# Patient Record
Sex: Male | Born: 1955 | Race: White | Hispanic: No | Marital: Married | Smoking: Never smoker
Health system: Southern US, Community
[De-identification: ages and names within clinical notes are randomized; demographics above are authoritative.]

## PROBLEM LIST (undated history)

## (undated) DIAGNOSIS — K635 Polyp of colon: Secondary | ICD-10-CM

## (undated) DIAGNOSIS — R739 Hyperglycemia, unspecified: Secondary | ICD-10-CM

## (undated) HISTORY — DX: Hyperglycemia, unspecified: R73.9

## (undated) HISTORY — DX: Polyp of colon: K63.5

---

## 1992-11-17 HISTORY — PX: BACK SURGERY: SHX140

## 2008-02-04 DIAGNOSIS — Z860101 Personal history of adenomatous and serrated colon polyps: Secondary | ICD-10-CM | POA: Insufficient documentation

## 2010-03-12 ENCOUNTER — Ambulatory Visit: Payer: Self-pay | Admitting: Urology

## 2010-03-12 IMAGING — CT CT ABD-PELV W/O CM
1 of 2 series · 15 of 32 positions shown, 19 images · non-contrast
Comparison: None

REASON FOR EXAM: hematuria
COMMENTS:

PROCEDURE:     CT  - CT ABDOMEN AND PELVIS W[DATE]  [DATE]
RESULT:     Indication: Hematuria
TECHNIQUE: Multiple axial images from the lung bases to the symphysis pubis
were obtained without oral and without intravenous contrast.

[Series 2: stone · axial · 0.78mm/px · z∈[-662,-173]mm · 15 of 179 slices shown, 19 images]
[im 8/179  soft-tissue]
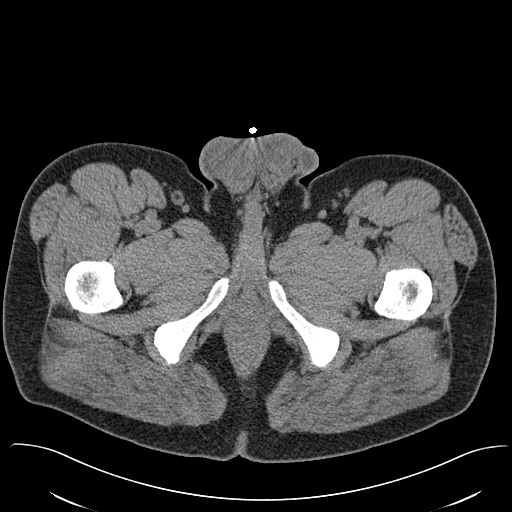
[im 8/179  bone]
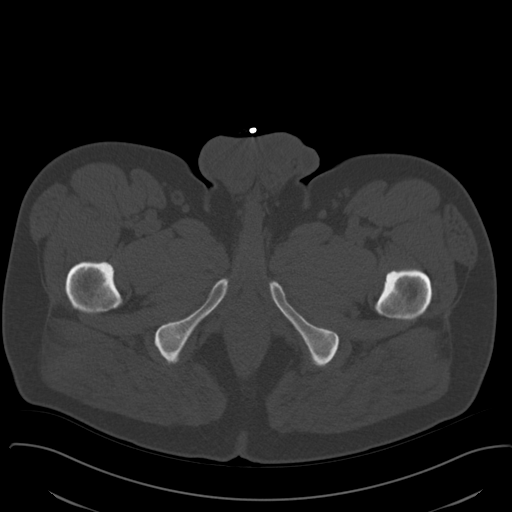
[im 24/179  soft-tissue]
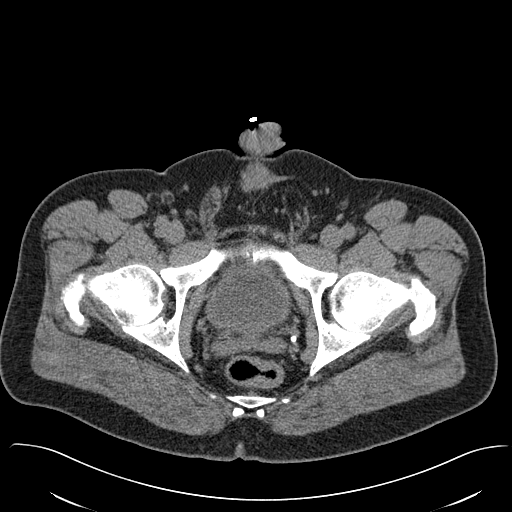
[im 39/179  soft-tissue]
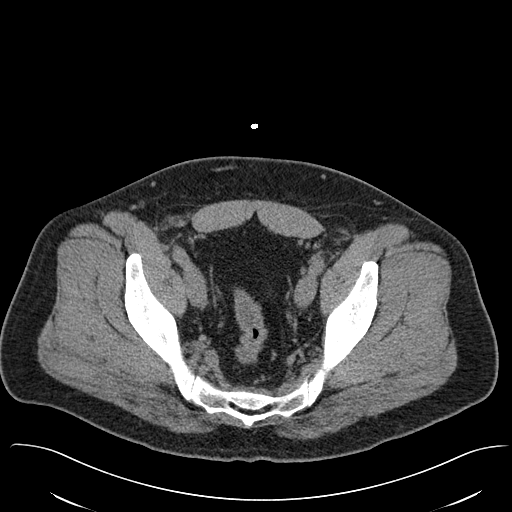
[im 47/179  soft-tissue]
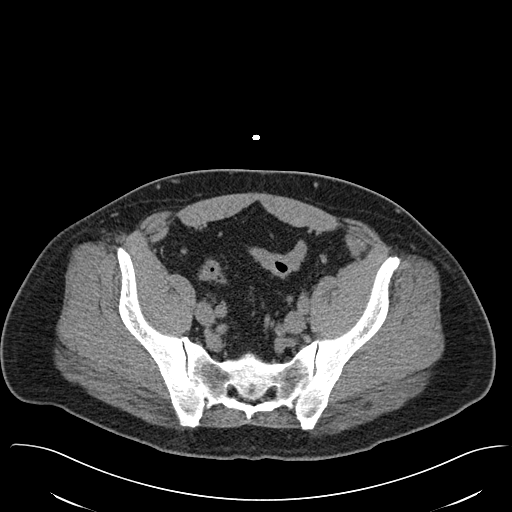
[im 62/179  soft-tissue]
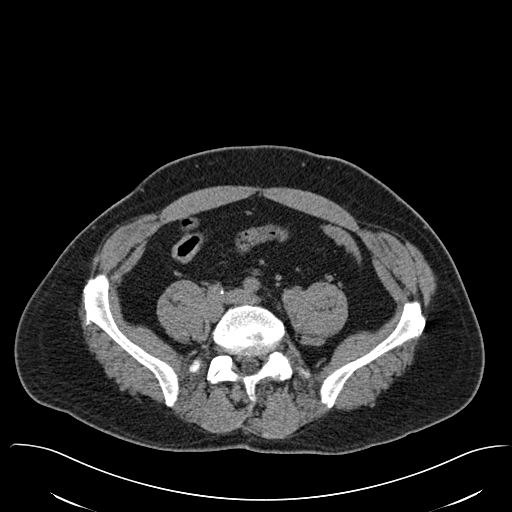
[im 78/179  soft-tissue]
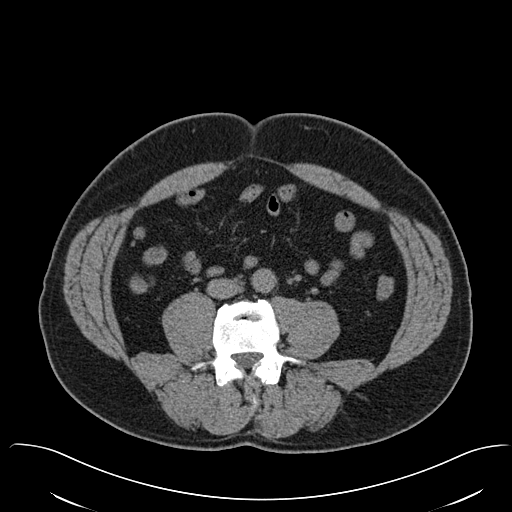
[im 93/179  soft-tissue]
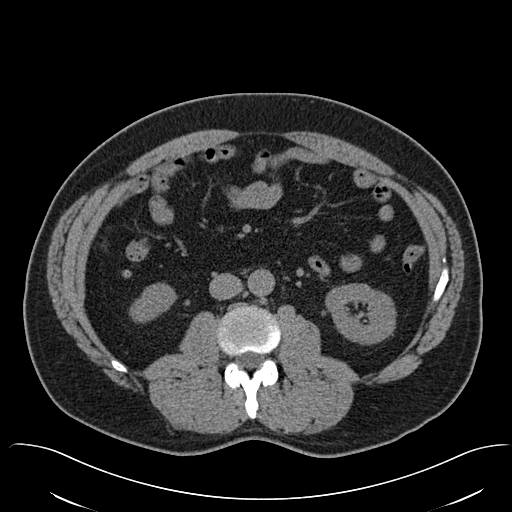
[im 101/179  soft-tissue]
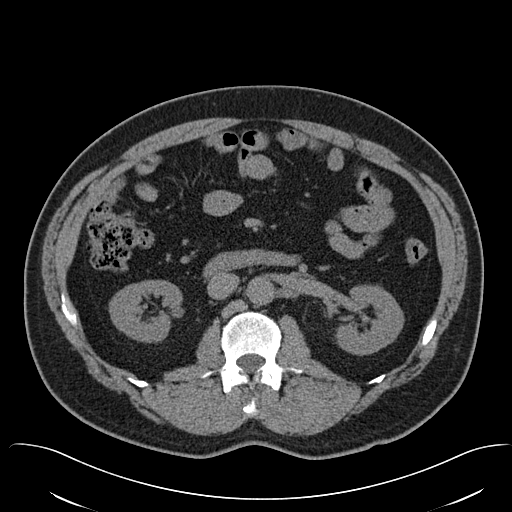
[im 117/179  soft-tissue]
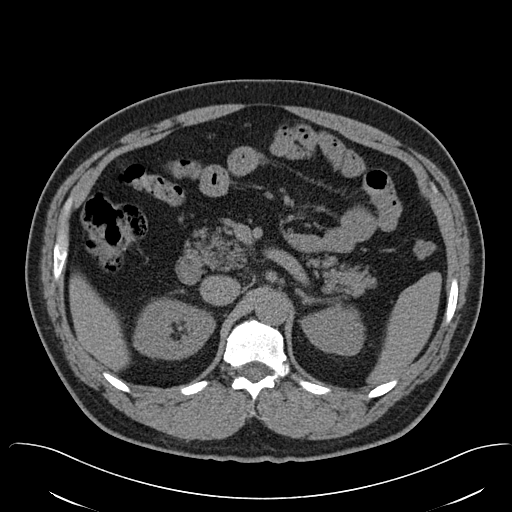
[im 117/179  bone]
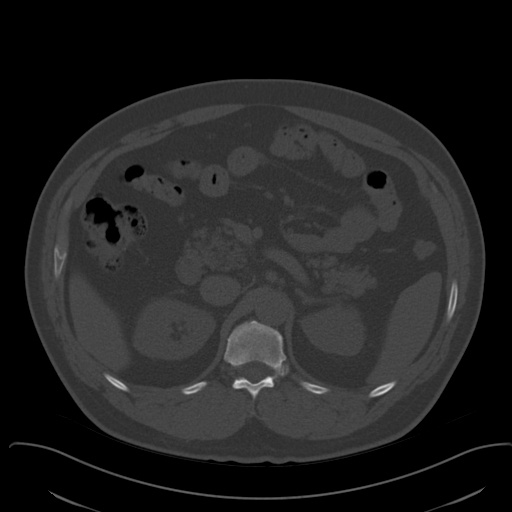
[im 132/179  soft-tissue]
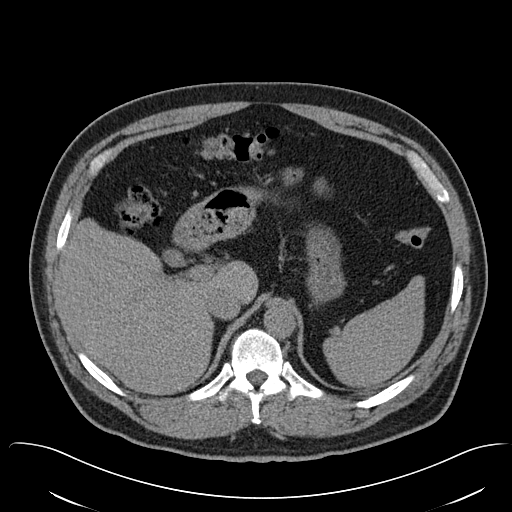
[im 140/179  soft-tissue]
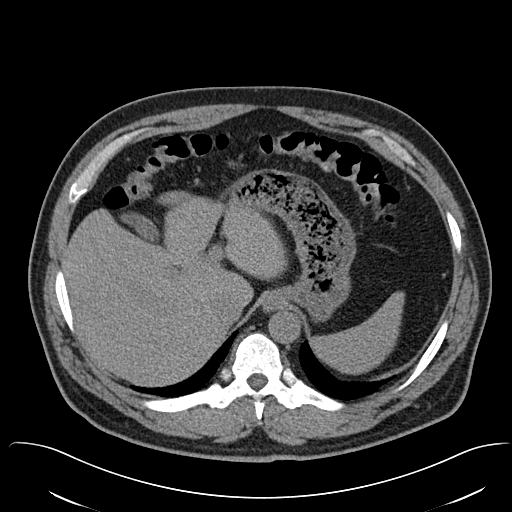
[im 148/179  lung]
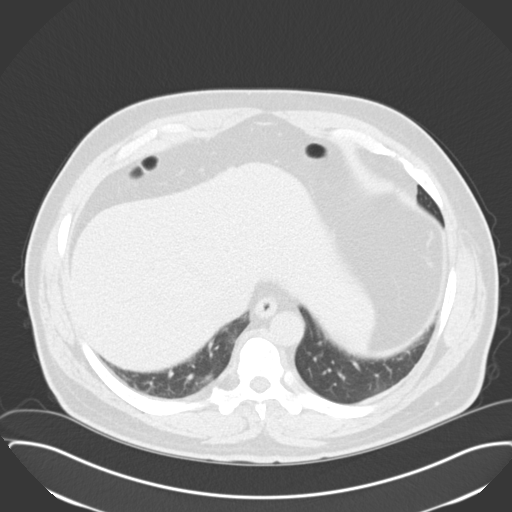
[im 155/179  soft-tissue]
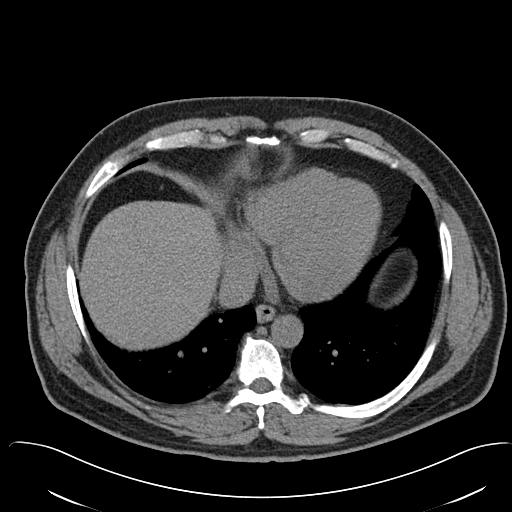
[im 155/179  lung]
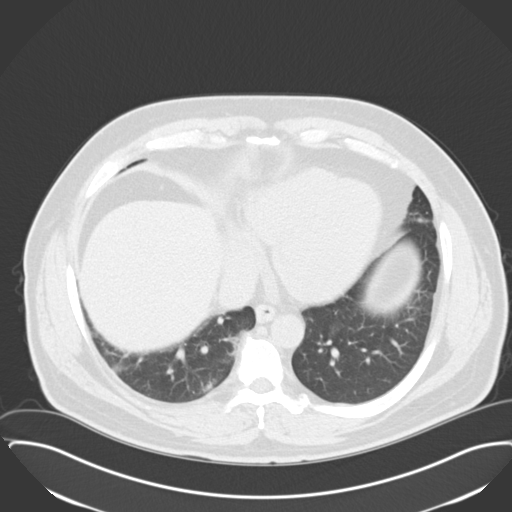
[im 163/179  lung]
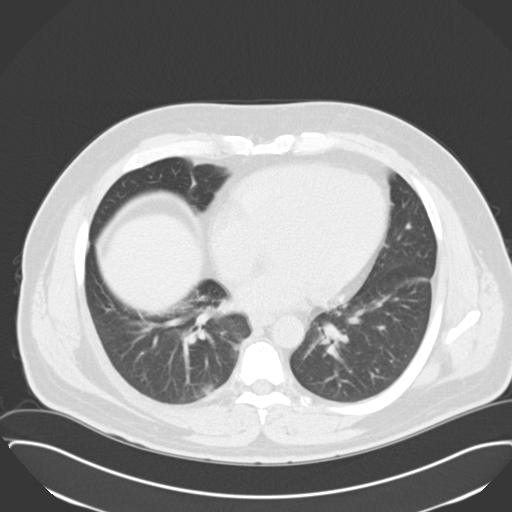
[im 171/179  soft-tissue]
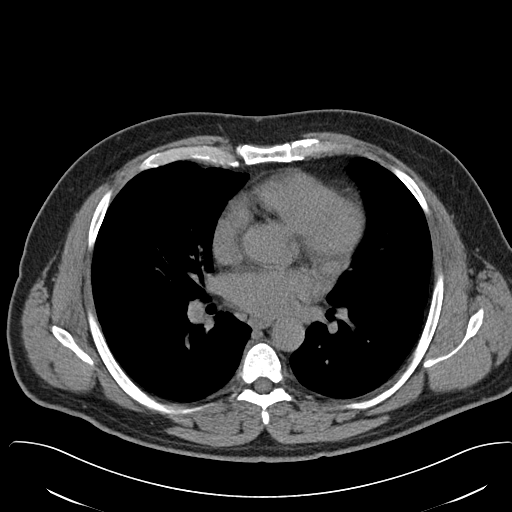
[im 171/179  lung]
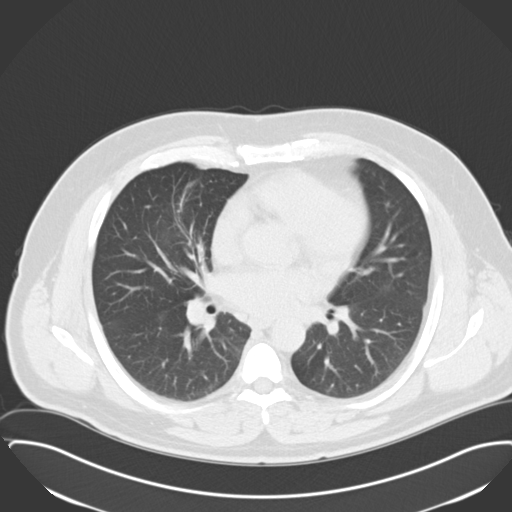

[15 of 32 positions shown; findings below may reference images not displayed]

FINDINGS: The lung bases are clear. There is no pleural or pericardial effusions.

There is a punctate nonobstructing left renal calculus. No obstructive
uropathy. No perinephric stranding is seen. The kidneys are symmetric in
size without evidence for exophytic mass. The bladder is unremarkable. The
prostate gland is enlarged measuring 5.2 x 3.9 cm with associated prostatic
calcifications.

The liver demonstrates no focal abnormality. The gallbladder is
unremarkable. The spleen demonstrates no focal abnormality. The adrenal
glands and pancreas are normal.

The unopacified stomach, duodenum, small intestine, and large intestine are
unremarkable, but evaluation is limited by lack of oral contrast. There is a
normal caliber appendix in the right lower quadrant without periappendiceal
inflammatory changes. There is no pneumoperitoneum, pneumatosis, or portal
venous gas. There is no abdominal or pelvic free fluid. There is no
lymphadenopathy.

The abdominal aorta is normal in caliber.

There is lumbar spine spondylosis.
IMPRESSION: 1. Punctate nonobstructing left renal calculus.

2. Prostatic enlargement. Correlate with PSA and physical exam.

## 2010-06-30 IMAGING — CR DG ABDOMEN 1V
1 series · 2 of 2 positions shown · non-contrast
Comparison: none

REASON FOR EXAM: nephrouithiasis
COMMENTS:

[Series 1: supine kub · 0.17mm/px · 2 of 2 slices shown]
[im 1/2]
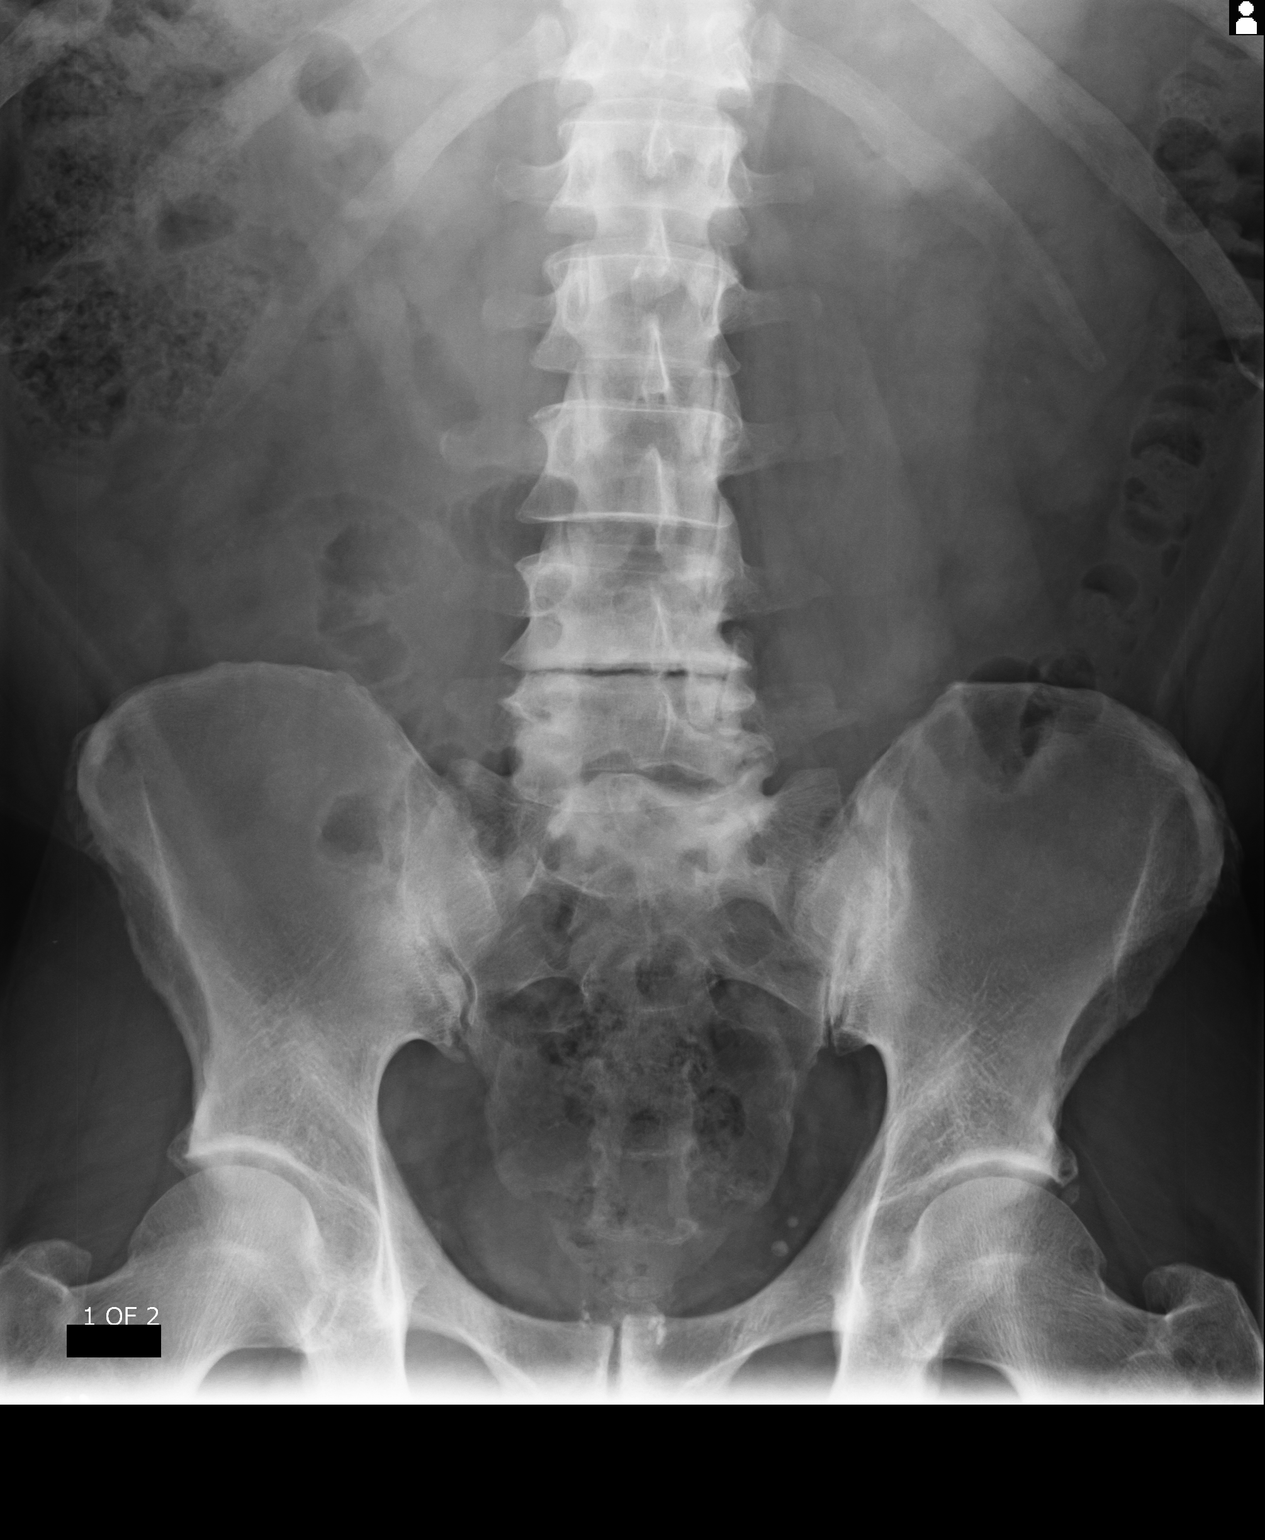
[im 2/2]
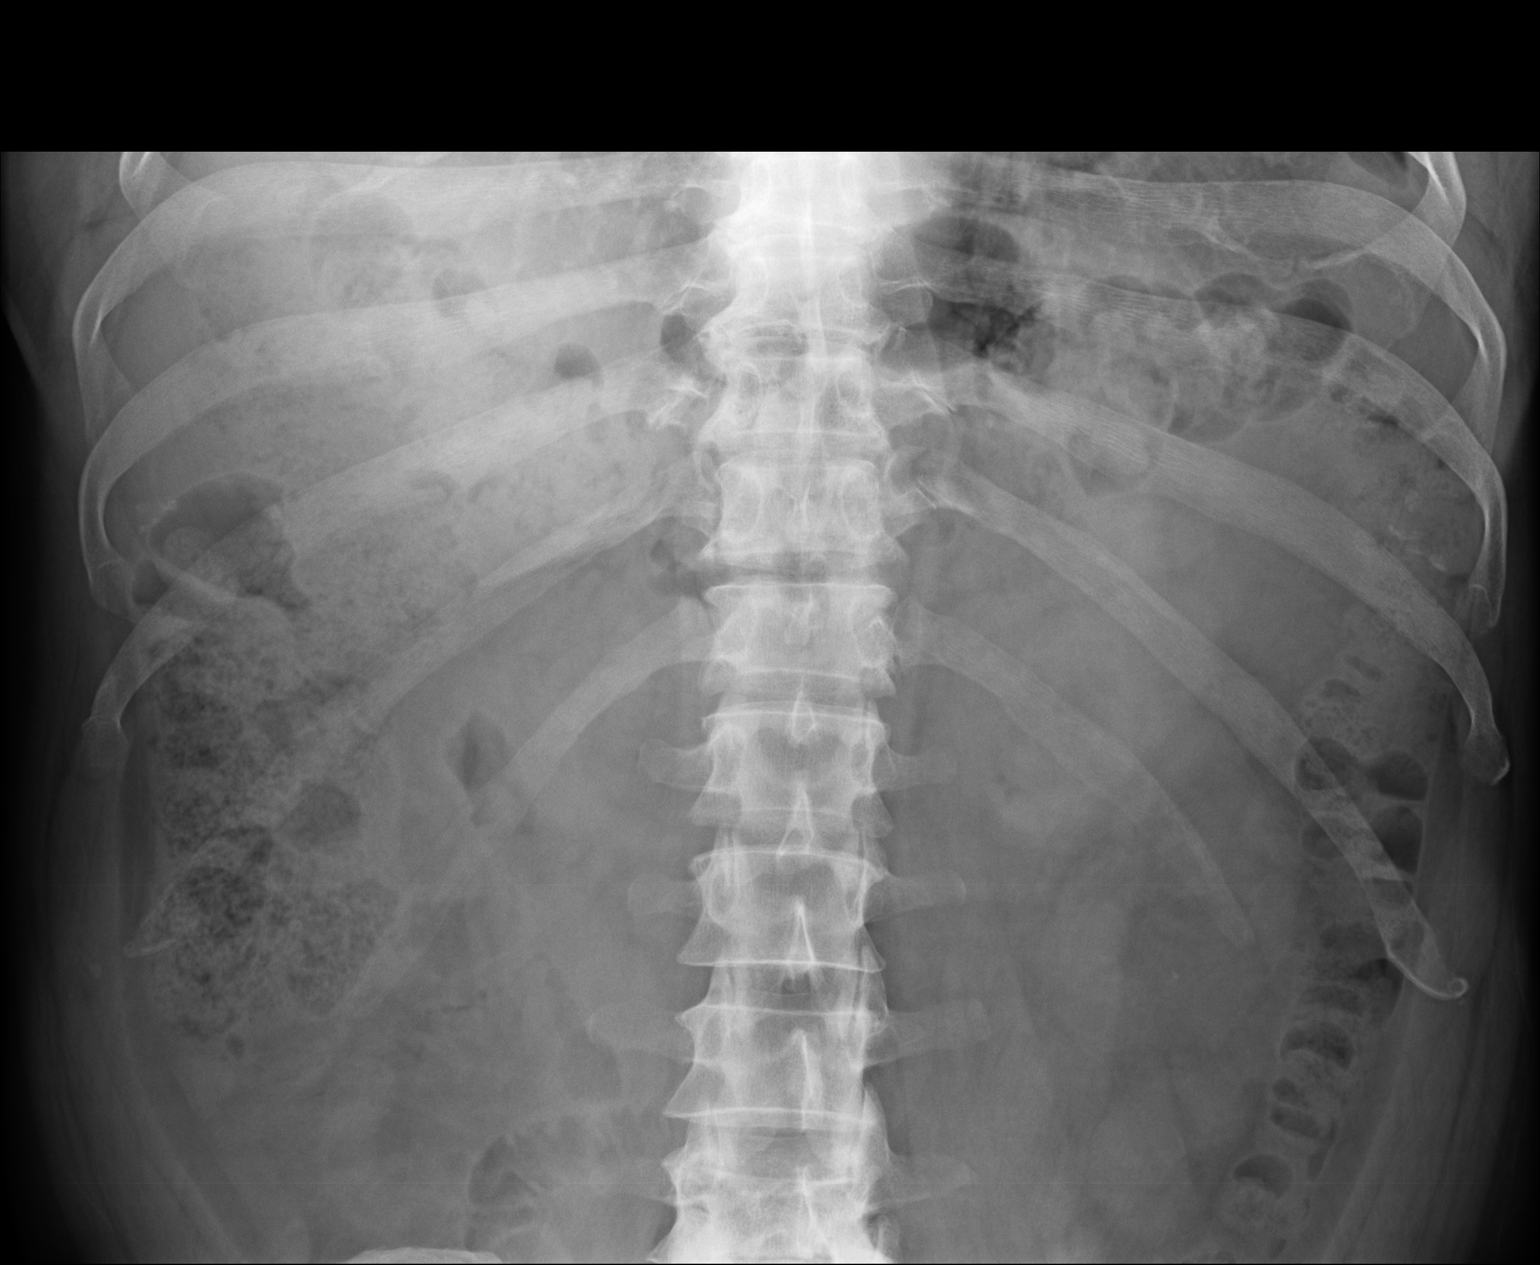

[2 of 2 positions shown; findings below may reference images not displayed]

PROCEDURE:     DXR - DXR KIDNEY URETER BLADDER  - [DATE]  [DATE]

RESULT:     Numerous rounded calcific densities are seen over the pelvic
region consistent with phleboliths. No ureteral stones are evident. The
kidneys show no definite calculi. Air and fecal material is present within
the colon to the rectum.
IMPRESSION: No nephrolithiasis or ureterolithiasis evident.

## 2010-10-22 ENCOUNTER — Ambulatory Visit: Payer: Self-pay | Admitting: Urology

## 2010-10-22 IMAGING — CR DG ABDOMEN 1V
1 series · 1 of 1 positions shown · non-contrast
Comparison: none

REASON FOR EXAM: nephrolithiasis
COMMENTS:

[view not recorded]
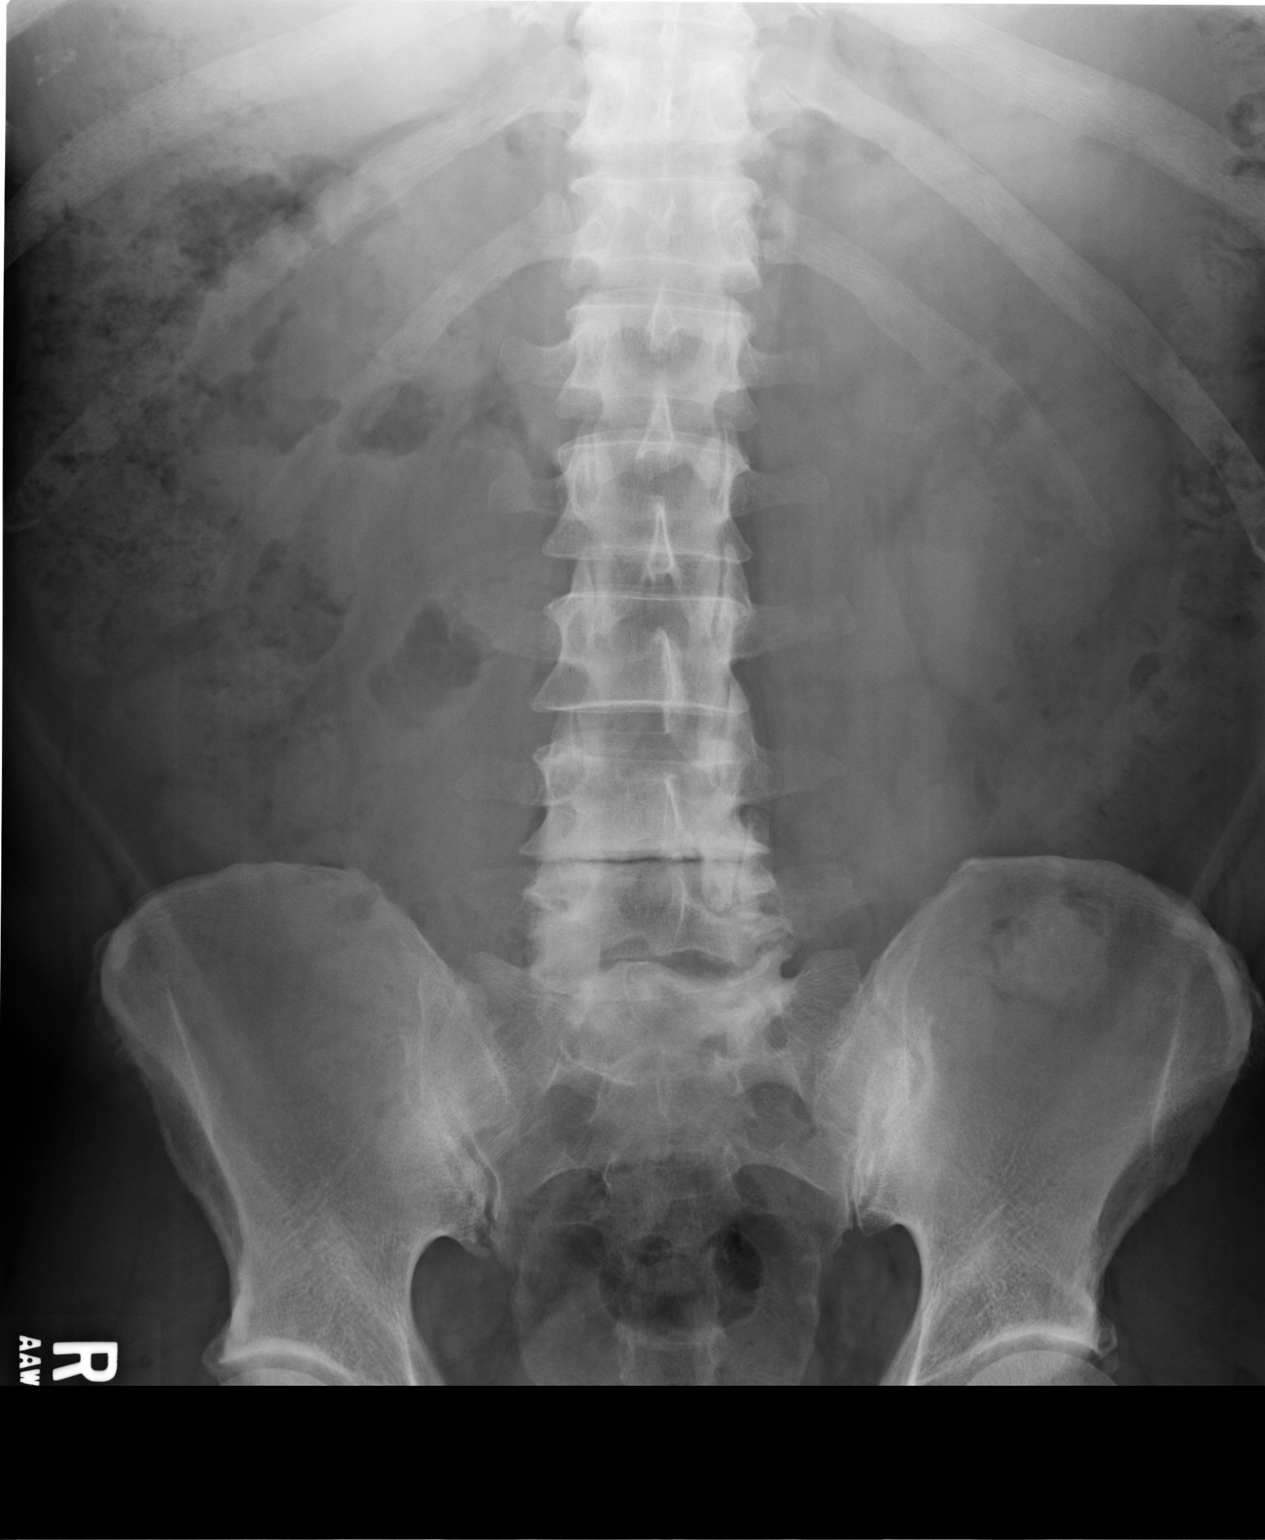

[1 of 1 positions shown; findings below may reference images not displayed]

PROCEDURE:     DXR - DXR KIDNEY URETER BLADDER  - [DATE]  [DATE]

RESULT:     There is a 2 mm calcification projected over the lower pole of
the left kidney consistent with left nephrolithiasis as previously observed
at CT. No ureteral calcifications are seen. Phleboliths are noted in the
left pelvis. No right renal calcifications are observed. There are changes
consistent with degenerative disc disease of the lower lumbar spine.
IMPRESSION: 1. Left nephrolithiasis.
2. There are degenerative changes of the lower lumbar spine.

## 2011-10-27 ENCOUNTER — Ambulatory Visit: Payer: Self-pay | Admitting: Urology

## 2013-07-07 HISTORY — PX: COLONOSCOPY: SHX174

## 2014-01-02 ENCOUNTER — Ambulatory Visit: Payer: Self-pay | Admitting: Family Medicine

## 2014-01-02 IMAGING — CR DG CHEST 2V
1 series · 2 of 2 positions shown · non-contrast
Comparison: None.

CLINICAL DATA: Right chest pain

EXAM:
CHEST  2 VIEW

[Series 1: pa · 0.17mm/px · 2 of 2 slices shown]
[im 1/2]
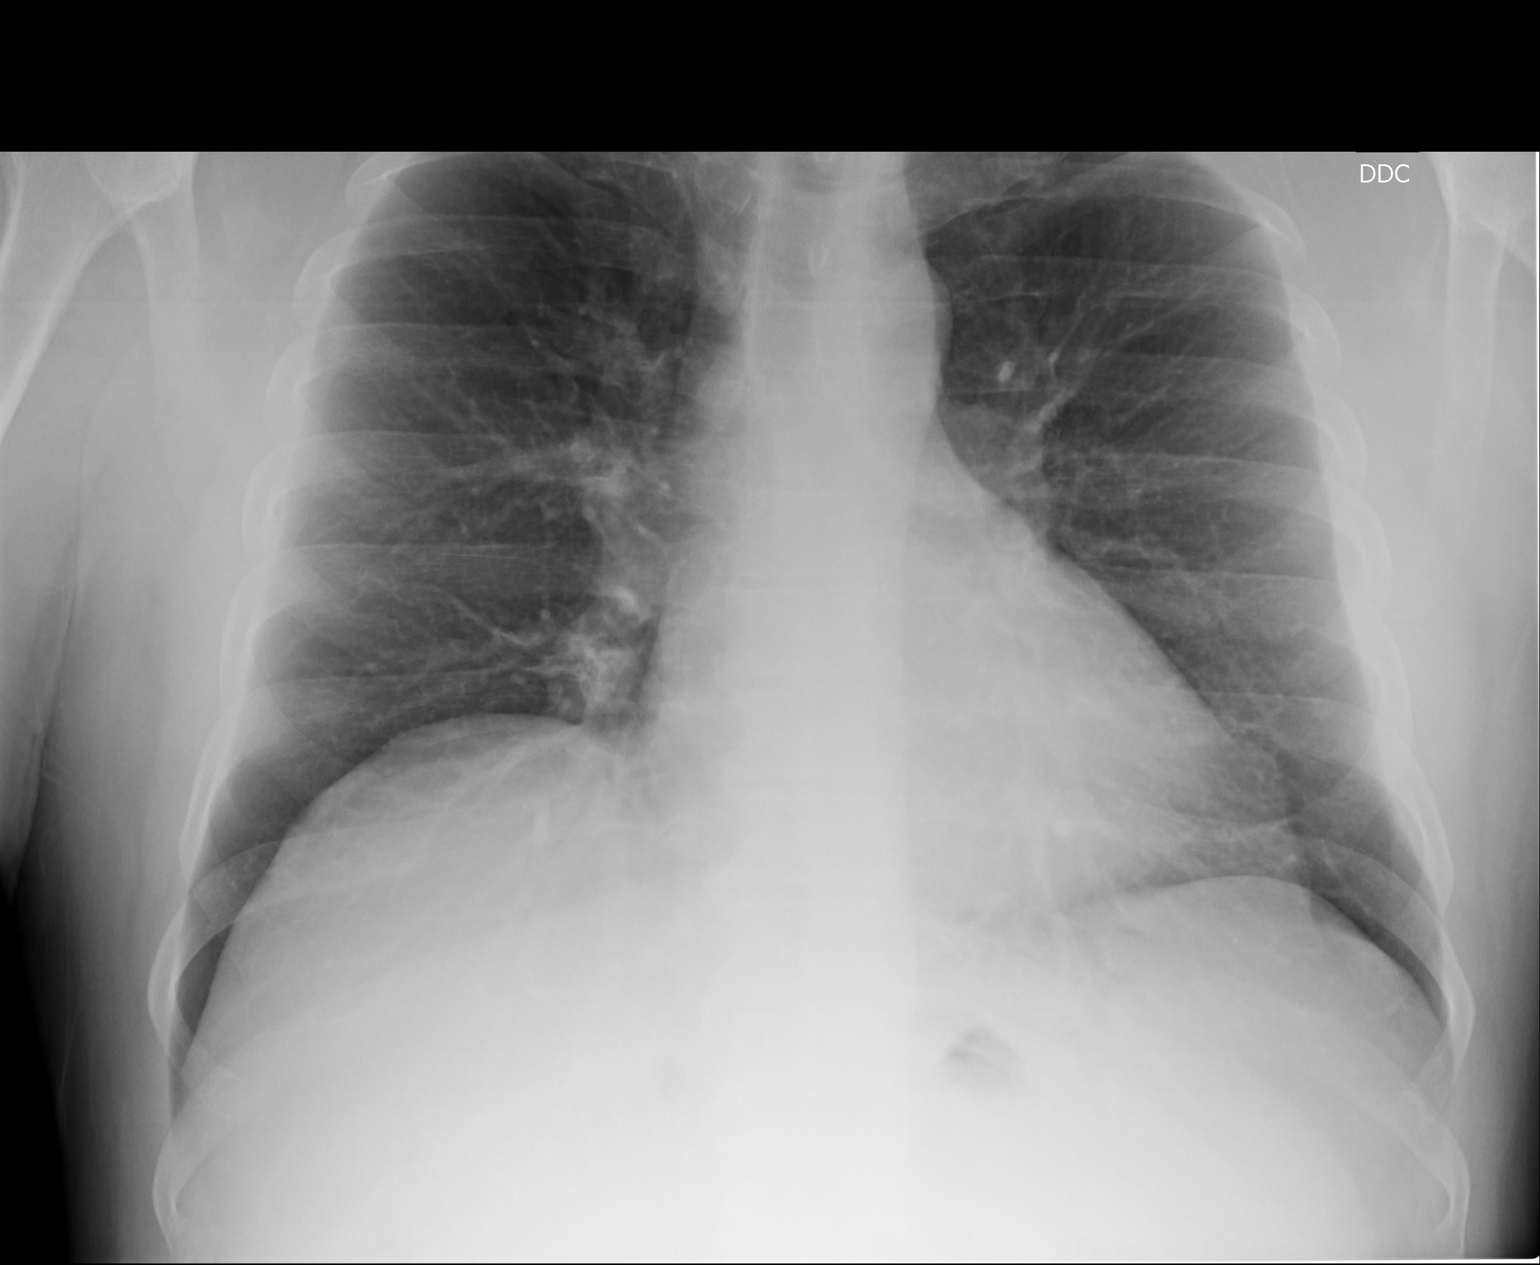
[im 2/2]
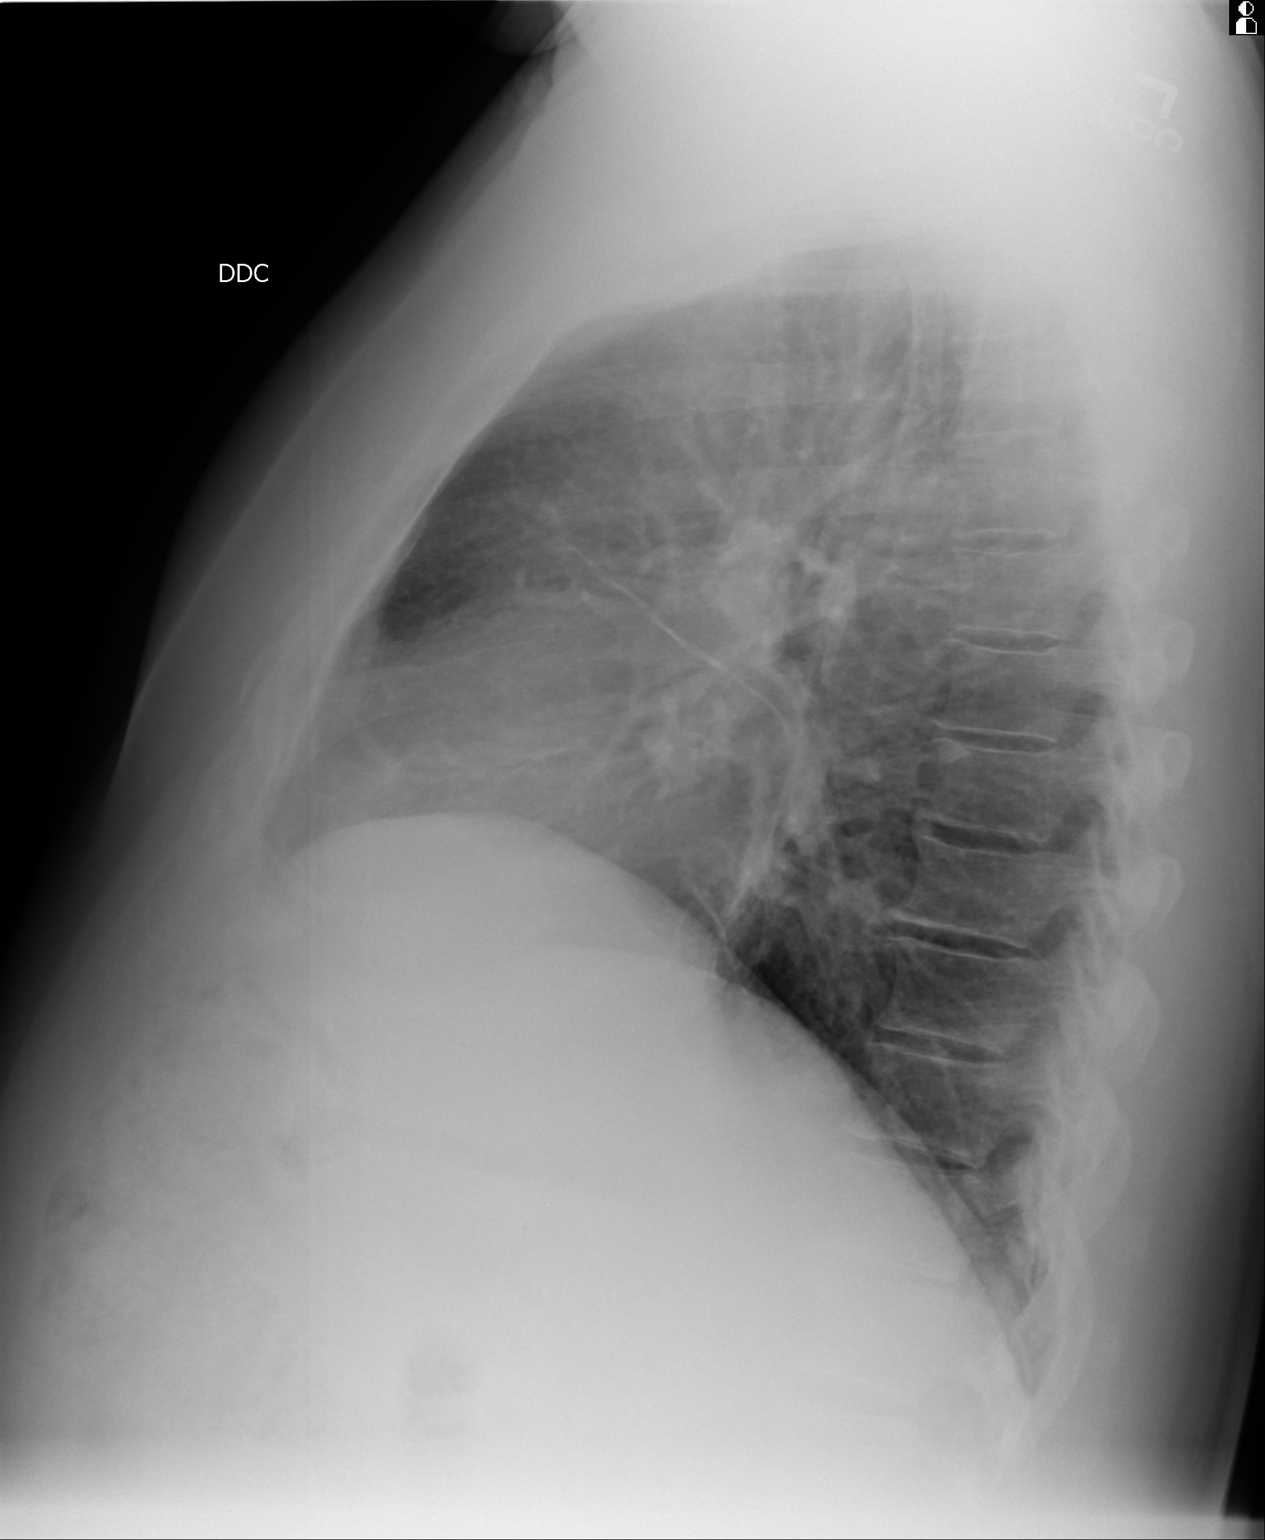

[2 of 2 positions shown; findings below may reference images not displayed]

FINDINGS: The lungs are clear and negative for focal airspace consolidation,
pulmonary edema or suspicious pulmonary nodule. No pleural effusion
or pneumothorax. Mild central bronchitic changes. Cardiac and
mediastinal contours are within normal limits. No acute fracture or
lytic or blastic osseous lesions. The visualized upper abdominal
bowel gas pattern is unremarkable.
IMPRESSION: No active cardiopulmonary disease.

## 2014-10-19 LAB — CBC AND DIFFERENTIAL
HCT: 48 % (ref 41–53)
Hemoglobin: 16.9 g/dL (ref 13.5–17.5)
PLATELETS: 224 10*3/uL (ref 150–399)
WBC: 4.6 10*3/mL

## 2015-03-28 LAB — LIPID PANEL
CHOLESTEROL: 210 mg/dL — AB (ref 0–200)
HDL: 53 mg/dL (ref 35–70)
LDL Cholesterol: 127 mg/dL
Triglycerides: 149 mg/dL (ref 40–160)

## 2015-03-28 LAB — BASIC METABOLIC PANEL
BUN: 16 mg/dL (ref 4–21)
CREATININE: 1.1 mg/dL (ref 0.6–1.3)
Glucose: 106 mg/dL
POTASSIUM: 4.7 mmol/L (ref 3.4–5.3)
Sodium: 140 mmol/L (ref 137–147)

## 2015-03-28 LAB — HEMOGLOBIN A1C: HEMOGLOBIN A1C: 5.6

## 2015-03-28 LAB — PSA: PSA: 0.5

## 2015-03-28 LAB — TSH: TSH: 8.15 u[IU]/mL — AB (ref 0.41–5.90)

## 2016-03-27 DIAGNOSIS — F439 Reaction to severe stress, unspecified: Secondary | ICD-10-CM | POA: Insufficient documentation

## 2016-03-27 DIAGNOSIS — R7303 Prediabetes: Secondary | ICD-10-CM | POA: Insufficient documentation

## 2016-03-27 DIAGNOSIS — E669 Obesity, unspecified: Secondary | ICD-10-CM | POA: Insufficient documentation

## 2016-03-27 DIAGNOSIS — E039 Hypothyroidism, unspecified: Secondary | ICD-10-CM | POA: Insufficient documentation

## 2016-03-27 DIAGNOSIS — R739 Hyperglycemia, unspecified: Secondary | ICD-10-CM | POA: Insufficient documentation

## 2016-03-27 DIAGNOSIS — R0789 Other chest pain: Secondary | ICD-10-CM | POA: Insufficient documentation

## 2016-03-31 ENCOUNTER — Encounter: Payer: Self-pay | Admitting: Family Medicine

## 2016-03-31 ENCOUNTER — Ambulatory Visit (INDEPENDENT_AMBULATORY_CARE_PROVIDER_SITE_OTHER): Payer: BLUE CROSS/BLUE SHIELD | Admitting: Family Medicine

## 2016-03-31 VITALS — BP 120/78 | HR 55 | Temp 98.5°F | Resp 16 | Ht 68.0 in | Wt 215.0 lb

## 2016-03-31 DIAGNOSIS — Z0000 Encounter for general adult medical examination without abnormal findings: Secondary | ICD-10-CM | POA: Diagnosis not present

## 2016-03-31 DIAGNOSIS — Z86010 Personal history of colonic polyps: Secondary | ICD-10-CM | POA: Diagnosis not present

## 2016-03-31 DIAGNOSIS — E039 Hypothyroidism, unspecified: Secondary | ICD-10-CM

## 2016-03-31 DIAGNOSIS — E038 Other specified hypothyroidism: Secondary | ICD-10-CM | POA: Diagnosis not present

## 2016-03-31 DIAGNOSIS — E669 Obesity, unspecified: Secondary | ICD-10-CM | POA: Diagnosis not present

## 2016-03-31 DIAGNOSIS — R739 Hyperglycemia, unspecified: Secondary | ICD-10-CM | POA: Diagnosis not present

## 2016-03-31 NOTE — Patient Instructions (Addendum)
   Please contact your eyecare professional to schedule a routine eye exam   It is recommended to engage in 150 minutes of moderate exercise every week.   

## 2016-03-31 NOTE — Progress Notes (Signed)
Patient: Reginald Simpson, Male    DOB: 14-Jun-1956, 60 y.o.   MRN: [id number] Visit Date: 03/31/2016  Today's Provider: Mila Merryonald Fisher, MD   Chief Complaint  Patient presents with  . Annual Exam  . Hypothyroidism  . Hyperglycemia   Subjective:    Annual physical exam Reginald Simpson is a 60 y.o. male who presents today for health maintenance and complete physical. He feels well. He reports exercising; yes/walking. He reports he is sleeping fairly well.  -----------------------------------------------------------------  Follow-up for subclinical hypothyroidism from 03/28/2015, no changes. Follow-up for hyperglycemia from 03/28/2015; advised to avoid sweets and starchy foods.     Review of Systems  Constitutional: Negative for fever, chills, appetite change and fatigue.  HENT: Negative for congestion, ear pain, hearing loss, nosebleeds and trouble swallowing.   Eyes: Negative for pain and visual disturbance.  Respiratory: Negative for cough, chest tightness and shortness of breath.   Cardiovascular: Negative for chest pain, palpitations and leg swelling.  Gastrointestinal: Negative for nausea, vomiting, abdominal pain, diarrhea, constipation and blood in stool.  Endocrine: Negative for polydipsia, polyphagia and polyuria.  Genitourinary: Negative for dysuria and flank pain.  Musculoskeletal: Negative for myalgias, back pain, joint swelling, arthralgias and neck stiffness.  Skin: Negative for color change, rash and wound.  Neurological: Negative for dizziness, tremors, seizures, speech difficulty, weakness, light-headedness and headaches.  Psychiatric/Behavioral: Negative for behavioral problems, confusion, sleep disturbance, dysphoric mood and decreased concentration. The patient is not nervous/anxious.     Social History      He  reports that he has never smoked. He does not have any smokeless tobacco history on file. He reports that he drinks alcohol. He reports that he does  not use illicit drugs.       Social History   Social History  . Marital Status: Married    Spouse Name: N/A  . Number of Children: 3  . Years of Education: N/A   Occupational History  . Self employed    Social History Main Topics  . Smoking status: Never Smoker   . Smokeless tobacco: None  . Alcohol Use: 0.0 oz/week    0 Standard drinks or equivalent per week     Comment: 2-3 beers or glasses of wine 3 night each week  . Drug Use: No  . Sexual Activity: Not Asked   Other Topics Concern  . None   Social History Narrative    Past Medical History  Diagnosis Date  . Thyroid disease   . Colon polyps   . Hyperglycemia      Patient Active Problem List   Diagnosis Date Noted  . 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 Obesity 03/27/2016  . Chest wall pain 03/27/2016  . Hyperglycemia 03/27/2016  . Elevated TSH 03/27/2016  . Situational stress 03/27/2016  . Family history of prostate cancer 06/17/2010  . Hematuria 01/22/2010  . History of colon polyps 02/04/2008    Past Surgical History  Procedure Laterality Date  . Colonoscopy  07/07/2013    Mild active colitis of Cecum, [facility]plastic [facility] rect[facility]  . Back surgery  1994    Family History        Family Status  Relation Status Death Age  . Mother Alive   . Father Deceased [id number]    Cause of Death: Prostate cancer  . Sister Alive   . Brother Alive   . Brother Alive         His family  history includes Hypertension in his mother; Prostate cancer (age of onset: 25) in his father.    No Known Allergies  Previous Medications   ASPIRIN 81 MG TABLET    Take 81 mg by mouth daily.    Patient Care Team: Malva Limes, MD as PCP - General (Family Medicine) Riki Altes, MD (Urology)     Objective:   Vitals: BP 120/78 mmHg  Pulse 55  Temp(Src) 98.5 F (36.9 C) (Oral)  Resp 16  Ht  (1.727 m)  Wt 215 lb (97.523 kg)  BMI 32.70 kg/m2  SpO2 95%   Physical  Exam   General Appearance:    Alert, cooperative, no distress, appears stated age, obese  Head:    Normocephalic, without obvious abnormality, atraumatic  Eyes:    PERRL, conjunctiva/corneas clear, EOM's intact, fundi    benign, both eyes       Ears:    Normal TM's and external ear canals, both ears  Nose:   Nares normal, septum midline, mucosa normal, no drainage   or sinus tenderness  Throat:   Lips, mucosa, and tongue normal; teeth and gums normal  Neck:   Supple, symmetrical, trachea midline, no adenopathy;       thyroid:  No enlargement/tenderness/nodules; no carotid   bruit or JVD  Back:     Symmetric, no curvature, ROM normal, no CVA tenderness  Lungs:     Clear to auscultation bilaterally, respirations unlabored  Chest wall:    No tenderness or deformity  Heart:    Regular rate and rhythm, S1 and S2 normal, no murmur, rub   or gallop  Abdomen:     Soft, non-tender, bowel sounds active all four quadrants,    no masses, no organomegaly  Genitalia:    deferred  Rectal:    deferred  Extremities:   Extremities normal, atraumatic, no cyanosis or edema  Pulses:   2+ and symmetric all extremities  Skin:   Skin color, texture, turgor normal, no rashes or lesions  Lymph nodes:   Cervical, supraclavicular, and axillary nodes normal  Neurologic:   CNII-XII intact. Normal strength, sensation and reflexes      throughout     Depression Screen PHQ 2/9 Scores 03/31/2016  PHQ - 2 Score 0  PHQ- 9 Score 0      Assessment & Plan:     Routine Health Maintenance and Physical Exam  Exercise Activities and Dietary recommendations Goals    None      Immunization History  Administered Date(s) Administered  . Tdap 01/05/2008    Health Maintenance  Topic Date Due  . HIV Screening  10/12/1972  . INFLUENZA VACCINE  06/17/2016  . TETANUS/TDAP  01/04/2018  . COLONOSCOPY  07/07/2018  . Hepatitis C Screening  Completed      Discussed health benefits of physical activity, and  encouraged him to engage in regular exercise appropriate for his age and condition.    --------------------------------------------------------------------  1. Annual physical exam Generally doing well  - Lipid panel - Comprehensive metabolic panel - PSA  2. Subclinical hypothyroidism  - T4 AND TSH  3. Obesity D&E  4. Hyperglycemia  - Hemoglobin A1c  5. History of colon polyps Colonoscopy in 2015.

## 2016-04-01 LAB — COMPREHENSIVE METABOLIC PANEL
ALBUMIN: 4.3 g/dL (ref 3.5–5.5)
ALK PHOS: 82 IU/L (ref 39–117)
ALT: 50 IU/L — ABNORMAL HIGH (ref 0–44)
AST: 29 IU/L (ref 0–40)
Albumin/Globulin Ratio: 1.9 (ref 1.2–2.2)
BUN / CREAT RATIO: 18 (ref 9–20)
BUN: 16 mg/dL (ref 6–24)
Bilirubin Total: 0.7 mg/dL (ref 0.0–1.2)
CALCIUM: 8.7 mg/dL (ref 8.7–10.2)
CO2: 25 mmol/L (ref 18–29)
CREATININE: 0.88 mg/dL (ref 0.76–1.27)
Chloride: 100 mmol/L (ref 96–106)
GFR calc Af Amer: 109 mL/min/{1.73_m2} (ref >59)
GFR, EST NON AFRICAN AMERICAN: 95 mL/min/{1.73_m2} (ref >59)
GLUCOSE: 111 mg/dL — AB (ref 65–99)
Globulin, Total: 2.3 g/dL (ref 1.5–4.5)
Potassium: 4.7 mmol/L (ref 3.5–5.2)
Sodium: 140 mmol/L (ref 134–144)
Total Protein: 6.6 g/dL (ref 6.0–8.5)

## 2016-04-01 LAB — LIPID PANEL
Chol/HDL Ratio: 4 ratio units (ref 0.0–5.0)
Cholesterol, Total: 218 mg/dL — ABNORMAL HIGH (ref 100–199)
HDL: 55 mg/dL (ref >39)
LDL Calculated: 130 mg/dL — ABNORMAL HIGH (ref 0–99)
Triglycerides: 163 mg/dL — ABNORMAL HIGH (ref 0–149)
VLDL CHOLESTEROL CAL: 33 mg/dL (ref 5–40)

## 2016-04-01 LAB — HEMOGLOBIN A1C
ESTIMATED AVERAGE GLUCOSE: 114 mg/dL
Hgb A1c MFr Bld: 5.6 % (ref 4.8–5.6)

## 2016-04-01 LAB — T4 AND TSH
T4 TOTAL: 6.9 ug/dL (ref 4.5–12.0)
TSH: 6.08 u[IU]/mL — AB (ref 0.450–4.500)

## 2016-04-01 LAB — PSA: PROSTATE SPECIFIC AG, SERUM: 0.5 ng/mL (ref 0.0–4.0)

## 2016-04-17 ENCOUNTER — Encounter: Payer: Self-pay | Admitting: Family Medicine

## 2017-03-17 HISTORY — PX: SHOULDER ARTHROSCOPY: SHX128

## 2017-04-06 ENCOUNTER — Encounter: Payer: Self-pay | Admitting: Family Medicine

## 2017-05-27 ENCOUNTER — Ambulatory Visit (INDEPENDENT_AMBULATORY_CARE_PROVIDER_SITE_OTHER): Payer: Self-pay | Admitting: Family Medicine

## 2017-05-27 ENCOUNTER — Encounter: Payer: Self-pay | Admitting: Family Medicine

## 2017-05-27 VITALS — BP 128/82 | HR 58 | Temp 98.8°F | Resp 16 | Ht 68.0 in | Wt 224.0 lb

## 2017-05-27 DIAGNOSIS — Z6834 Body mass index (BMI) 34.0-34.9, adult: Secondary | ICD-10-CM

## 2017-05-27 DIAGNOSIS — Z125 Encounter for screening for malignant neoplasm of prostate: Secondary | ICD-10-CM

## 2017-05-27 DIAGNOSIS — R5383 Other fatigue: Secondary | ICD-10-CM

## 2017-05-27 DIAGNOSIS — Z0000 Encounter for general adult medical examination without abnormal findings: Secondary | ICD-10-CM

## 2017-05-27 NOTE — Progress Notes (Signed)
Patient: Reginald Simpson, Male    DOB: 1956/04/26, 61 y.o.   MRN: [id number] Visit Date: 05/27/2017  Today's Provider: [name], MD   Chief Complaint  Patient presents with  . Annual Exam  . Hyperglycemia  . Obesity   Subjective:    Annual physical exam Reginald Simpson is a 61 y.o. male who presents today for health maintenance and complete physical. He feels well. He reports exercising occasionally. He has not been active since he had his shoulder surgery in 03/2017. He reports he is sleeping fairly well.  Colonoscopy- 2014. 2 polyps removed. Repeat in 3-5 years.  Tdap- 01/05/2008.    Hyperglycemia, Follow-up:   Lab Results  Component Value Date   HGBA1C 5.6 03/31/2016   HGBA1C 5.6 03/28/2015   GLUCOSE 111 (H) 03/31/2016    Last seen for for this 1 years ago.  Management since then includes no changes. Current symptoms include none and have been stable.  Weight trend: stable Prior visit with dietician: no Current diet: well balanced Current exercise: no regular exercise  Pertinent Labs:    Component Value Date/Time   CHOL 218 (H) 03/31/2016 1003   TRIG 163 (H) 03/31/2016 1003   CHOLHDL 4.0 03/31/2016 1003   CREATININE 0.88 03/31/2016 1003    Wt Readings from Last 3 Encounters:  05/27/17 224 lb (101.6 kg)  03/31/16 215 lb (97.5 kg)  03/28/15 214 lb (97.1 kg)     Review of Systems  Constitutional: Positive for activity change, appetite change and fatigue. Negative for chills, diaphoresis, fever and unexpected weight change.  HENT: Negative.   Eyes: Negative.   Respiratory: Negative.   Cardiovascular: Negative.   Gastrointestinal: Negative.   Endocrine: Negative.   Genitourinary: Negative.   Musculoskeletal: Negative.   Allergic/Immunologic: Negative.   Neurological: Negative.   Hematological: Negative.   Psychiatric/Behavioral: Negative.     Social History      He  reports that he has never smoked. He has never used smokeless tobacco. He  reports that he drinks alcohol. He reports that he does not use drugs.       Social History   Social History  . Marital status: Married    Spouse name: N/A  . Number of children: 3  . Years of education: N/A   Occupational History  . Self employed    Social History Main Topics  . Smoking status: Never Smoker  . Smokeless tobacco: Never Used  . Alcohol use 0.0 oz/week     Comment: 2-3 beers or glasses of wine 3 night each week  . Drug use: No  . Sexual activity: Not Asked   Other Topics Concern  . None   Social History Narrative  . None    Past Medical History:  Diagnosis Date  . Colon polyps   . Hyperglycemia      Patient Active Problem List   Diagnosis Date Noted  . 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 Obesity 03/27/2016  . Chest wall pain 03/27/2016  . Hyperglycemia 03/27/2016  . Situational stress 03/27/2016  . Family history of prostate cancer 06/17/2010  . Hematuria 01/22/2010  . History of colon polyps 02/04/2008    Past Surgical History:  Procedure Laterality Date  . BACK SURGERY  1994  . COLONOSCOPY  07/07/2013   Mild active colitis of Cecum, Hyperplastic polyp in rectum. [facility]    Family History        Family Status  Relation Status  .  Mother Alive  . Father Deceased at age 45       Cause of Death: Prostate cancer  . Sister Alive  . Brother Alive  . Brother Alive        His family history includes Hypertension in his mother; Prostate cancer (age of onset: 61) in his father.     No Known Allergies   Current Outpatient Prescriptions:  .  aspirin 81 MG tablet, Take 81 mg by mouth daily., Disp: , Rfl:    Patient Care Team: Malva Limes, MD as PCP - General (Family Medicine) Riki Altes, MD (Urology)      Objective:   Vitals: BP 128/82 (BP Location: Right Arm, Patient Position: Sitting, Cuff Size: Normal)   Pulse (!) 58   Temp 98.8 F (37.1 C)   Resp 16   Ht 5\' 8"  (1.727 m)   Wt 224 lb (101.6  kg)   SpO2 94%   BMI 34.06 kg/m    Vitals:   05/27/17 1020  BP: 128/82  Pulse: (!) 58  Resp: 16  Temp: 98.8 F (37.1 C)  SpO2: 94%  Weight: 224 lb (101.6 kg)  Height: 5\' 8"  (1.727 m)     Physical Exam   General Appearance:    Alert, cooperative, no distress, appears stated age  Head:    Normocephalic, without obvious abnormality, atraumatic  Eyes:    PERRL, conjunctiva/corneas clear, EOM's intact, fundi    benign, both eyes       Ears:    Normal TM's and external ear canals, both ears  Nose:   Nares normal, septum midline, mucosa normal, no drainage   or sinus tenderness  Throat:   Lips, mucosa, and tongue normal; teeth and gums normal  Neck:   Supple, symmetrical, trachea midline, no adenopathy;       thyroid:  No enlargement/tenderness/nodules; no carotid   bruit or JVD  Back:     Symmetric, no curvature, ROM normal, no CVA tenderness  Lungs:     Clear to auscultation bilaterally, respirations unlabored  Chest wall:    No tenderness or deformity  Heart:    Regular rate and rhythm, S1 and S2 normal, no murmur, rub   or gallop  Abdomen:     Soft, non-tender, bowel sounds active all four quadrants,    no masses, no organomegaly  Genitalia:    deferred  Rectal:    normal tone, normal prostate, no masses or tenderness, no stool in vault and the prostate is of normal size and consistency, nontender, & without nodules  Extremities:   Extremities normal, atraumatic, no cyanosis or edema  Pulses:   2+ and symmetric all extremities  Skin:   Skin color, texture, turgor normal, no rashes or lesions  Lymph nodes:   Cervical, supraclavicular, and axillary nodes normal  Neurologic:   CNII-XII intact. Normal strength, sensation and reflexes      throughout    Depression Screen PHQ 2/9 Scores 05/27/2017 03/31/2016  PHQ - 2 Score 0 0  PHQ- 9 Score - 0      Assessment & Plan:     Routine Health Maintenance and Physical Exam  Exercise Activities and Dietary  recommendations Goals    None      Immunization History  Administered Date(s) Administered  . Tdap 01/05/2008    Health Maintenance  Topic Date Due  . HIV Screening  10/12/1972  . INFLUENZA VACCINE  06/17/2017  . TETANUS/TDAP  01/04/2018  . COLONOSCOPY  07/07/2018  .  Hepatitis C Screening  Completed     Discussed health benefits of physical activity, and encouraged him to engage in regular exercise appropriate for his age and condition.   1. Annual physical exam Discussed Shingrix vaccine. He is going to check on cost.  - Lipid panel  2. BMI 34.0-34.9,adult Has been much less active since shoulder surgery. Encouraged to increase exercise as tolerated and healthy food choice.   3. Prostate cancer screening  - PSA  4. Other fatigue  - Comprehensive metabolic panel - T4 AND TSH - CBC    Mila Merryonald Dashea Mcmullan, MD  [facility]

## 2017-05-27 NOTE — Patient Instructions (Addendum)
The CDC recommends two doses of Shingrix separated by 2 to 6 months for immunocompetent adults age 60 years and older. I recommend checking with your insurance plan regarding coverage for this vaccine.     Preventive Care 40-64 Years, Male Preventive care refers to lifestyle choices and visits with your health care provider that can promote health and wellness. What does preventive care include?  A yearly physical exam. This is also called an annual well check.  Dental exams once or twice a year.  Routine eye exams. Ask your health care provider how often you should have your eyes checked.  Personal lifestyle choices, including: ? Daily care of your teeth and gums. ? Regular physical activity. ? Eating a healthy diet. ? Avoiding tobacco and drug use. ? Limiting alcohol use. ? Practicing safe sex. ? Taking low-dose aspirin every day starting at age 68. What happens during an annual well check? The services and screenings done by your health care provider during your annual well check will depend on your age, overall health, lifestyle risk factors, and family history of disease. Counseling Your health care provider may ask you questions about your:  Alcohol use.  Tobacco use.  Drug use.  Emotional well-being.  Home and relationship well-being.  Sexual activity.  Eating habits.  Work and work Statistician.  Screening You may have the following tests or measurements:  Height, weight, and BMI.  Blood pressure.  Lipid and cholesterol levels. These may be checked every 5 years, or more frequently if you are over 14 years old.  Skin check.  Lung cancer screening. You may have this screening every year starting at age 23 if you have a 30-pack-year history of smoking and currently smoke or have quit within the past 15 years.  Fecal occult blood test (FOBT) of the stool. You may have this test every year starting at age 10.  Flexible sigmoidoscopy or colonoscopy. You  may have a sigmoidoscopy every 5 years or a colonoscopy every 10 years starting at age 13.  Prostate cancer screening. Recommendations will vary depending on your family history and other risks.  Hepatitis C blood test.  Hepatitis B blood test.  Sexually transmitted disease (STD) testing.  Diabetes screening. This is done by checking your blood sugar (glucose) after you have not eaten for a while (fasting). You may have this done every 1-3 years.  Discuss your test results, treatment options, and if necessary, the need for more tests with your health care provider. Vaccines Your health care provider may recommend certain vaccines, such as:  Influenza vaccine. This is recommended every year.  Tetanus, diphtheria, and acellular pertussis (Tdap, Td) vaccine. You may need a Td booster every 10 years.  Varicella vaccine. You may need this if you have not been vaccinated.  Zoster vaccine. You may need this after age 17.  Measles, mumps, and rubella (MMR) vaccine. You may need at least one dose of MMR if you were born in 1957 or later. You may also need a second dose.  Pneumococcal 13-valent conjugate (PCV13) vaccine. You may need this if you have certain conditions and have not been vaccinated.  Pneumococcal polysaccharide (PPSV23) vaccine. You may need one or two doses if you smoke cigarettes or if you have certain conditions.  Meningococcal vaccine. You may need this if you have certain conditions.  Hepatitis A vaccine. You may need this if you have certain conditions or if you travel or work in places where you may be exposed to  hepatitis A.  Hepatitis B vaccine. You may need this if you have certain conditions or if you travel or work in places where you may be exposed to hepatitis B.  Haemophilus influenzae type b (Hib) vaccine. You may need this if you have certain risk factors.  Talk to your health care provider about which screenings and vaccines you need and how often you  need them. This information is not intended to replace advice given to you by your health care provider. Make sure you discuss any questions you have with your health care provider. Document Released: 11/30/2015 Document Revised: 07/23/2016 Document Reviewed: 09/04/2015 Elsevier Interactive Patient Education  2017 [employer].

## 2017-05-28 ENCOUNTER — Encounter: Payer: Self-pay | Admitting: Family Medicine

## 2017-05-28 ENCOUNTER — Telehealth: Payer: Self-pay

## 2017-05-28 LAB — COMPREHENSIVE METABOLIC PANEL
A/G RATIO: 1.6 (ref 1.2–2.2)
ALK PHOS: 94 IU/L (ref 39–117)
ALT: 44 IU/L (ref 0–44)
AST: 29 IU/L (ref 0–40)
Albumin: 4.3 g/dL (ref 3.5–5.5)
BILIRUBIN TOTAL: 0.6 mg/dL (ref 0.0–1.2)
BUN/Creatinine Ratio: 18 (ref 9–20)
BUN: 16 mg/dL (ref 6–24)
CHLORIDE: 99 mmol/L (ref 96–106)
CO2: 24 mmol/L (ref 20–29)
Calcium: 9.4 mg/dL (ref 8.7–10.2)
Creatinine, Ser: 0.88 mg/dL (ref 0.76–1.27)
GFR calc Af Amer: 109 mL/min/{1.73_m2} (ref >59)
GFR calc non Af Amer: 94 mL/min/{1.73_m2} (ref >59)
GLOBULIN, TOTAL: 2.7 g/dL (ref 1.5–4.5)
Glucose: 113 mg/dL — ABNORMAL HIGH (ref 65–99)
POTASSIUM: 4.4 mmol/L (ref 3.5–5.2)
SODIUM: 139 mmol/L (ref 134–144)
Total Protein: 7 g/dL (ref 6.0–8.5)

## 2017-05-28 LAB — CBC
Hematocrit: 46.3 % (ref 37.5–51.0)
Hemoglobin: 16.2 g/dL (ref 13.0–17.7)
MCH: 30.3 pg (ref 26.6–33.0)
MCHC: 35 g/dL (ref 31.5–35.7)
MCV: 87 fL (ref 79–97)
Platelets: 213 10*3/uL (ref 150–379)
RBC: 5.35 x10E6/uL (ref 4.14–5.80)
RDW: 13.6 % (ref 12.3–15.4)
WBC: 5.4 10*3/uL (ref 3.4–10.8)

## 2017-05-28 LAB — LIPID PANEL
CHOL/HDL RATIO: 4.4 ratio (ref 0.0–5.0)
Cholesterol, Total: 243 mg/dL — ABNORMAL HIGH (ref 100–199)
HDL: 55 mg/dL (ref >39)
LDL CALC: 147 mg/dL — AB (ref 0–99)
TRIGLYCERIDES: 203 mg/dL — AB (ref 0–149)
VLDL CHOLESTEROL CAL: 41 mg/dL — AB (ref 5–40)

## 2017-05-28 LAB — T4 AND TSH
T4 TOTAL: 7 ug/dL (ref 4.5–12.0)
TSH: 5.71 u[IU]/mL — AB (ref 0.450–4.500)

## 2017-05-28 LAB — PSA: Prostate Specific Ag, Serum: 0.9 ng/mL (ref 0.0–4.0)

## 2017-05-28 MED ORDER — LEVOTHYROXINE SODIUM 75 MCG PO TABS: 75.0000 ug | ORAL_TABLET | Freq: Every day | ORAL | 3 refills | Status: DC

## 2017-05-28 NOTE — Telephone Encounter (Signed)
-----   Message from Malva Limesonald E Fisher, MD sent at 05/28/2017  7:30 AM EDT ----- Cholesterol is high at 243, should be under 200. He is a little hypothyroid which could cause fatigue and can also affect cholesterol. PSA is normal. Recommend he start levothyroxine for thyroid hormone replacement, 75mcg once daily, #30, rf x 3 and follow up in 3 month to check thyroid functions.   Need to avoid saturated fats by limiting red meat to 3 servings a week and avoid dairy. should recheck cholesterol at follow up in 3 months.

## 2017-05-28 NOTE — Telephone Encounter (Signed)
Advised patient of results. Med sent into the pharmacy.

## 2017-06-01 ENCOUNTER — Telehealth: Payer: Self-pay | Admitting: Family Medicine

## 2017-06-01 NOTE — Telephone Encounter (Signed)
04-29-17 Verbal order over phone by Midge Miniumarla Ryner to [facility].

## 2017-06-05 ENCOUNTER — Telehealth: Payer: Self-pay | Admitting: Family Medicine

## 2017-06-05 NOTE — Telephone Encounter (Signed)
Not common, but could be a side effect of thyroid medication. Have him take 1/2 tablet medication for a week, then go back up to a full tablet. Call if not feeling better on lower dose.

## 2017-06-05 NOTE — Telephone Encounter (Signed)
Please advise 

## 2017-06-05 NOTE — Telephone Encounter (Signed)
Pt advised.

## 2017-06-05 NOTE — Telephone Encounter (Signed)
Pt called saying he started taking levothyroxine about a week ago.  Pt has been having symptoms of dizziness, vertigo, upset stomach, no fever.  He wants to know whether its from the medication or something else.  His call back is  [phone]  Thanks teri

## 2017-08-06 ENCOUNTER — Telehealth: Payer: Self-pay

## 2017-08-06 ENCOUNTER — Emergency Department: Payer: No Typology Code available for payment source

## 2017-08-06 ENCOUNTER — Emergency Department
Admission: EM | Admit: 2017-08-06 | Discharge: 2017-08-06 | Disposition: A | Discharge status: Home / Self Care | Payer: No Typology Code available for payment source | Attending: Emergency Medicine | Admitting: Emergency Medicine

## 2017-08-06 ENCOUNTER — Encounter: Payer: Self-pay | Admitting: Emergency Medicine

## 2017-08-06 DIAGNOSIS — R079 Chest pain, unspecified: Secondary | ICD-10-CM

## 2017-08-06 DIAGNOSIS — R0789 Other chest pain: Secondary | ICD-10-CM

## 2017-08-06 DIAGNOSIS — E039 Hypothyroidism, unspecified: Secondary | ICD-10-CM | POA: Diagnosis not present

## 2017-08-06 LAB — BASIC METABOLIC PANEL
ANION GAP: 8 (ref 5–15)
BUN: 14 mg/dL (ref 6–20)
CALCIUM: 9 mg/dL (ref 8.9–10.3)
CO2: 27 mmol/L (ref 22–32)
Chloride: 102 mmol/L (ref 101–111)
Creatinine, Ser: 0.86 mg/dL (ref 0.61–1.24)
GFR calc Af Amer: >60 mL/min (ref >60)
GFR calc non Af Amer: >60 mL/min (ref >60)
GLUCOSE: 100 mg/dL — AB (ref 65–99)
POTASSIUM: 4.1 mmol/L (ref 3.5–5.1)
Sodium: 137 mmol/L (ref 135–145)

## 2017-08-06 LAB — TROPONIN I
Troponin I: <0.03 ng/mL (ref <0.03)
Troponin I: <0.03 ng/mL (ref <0.03)

## 2017-08-06 LAB — CBC
HEMATOCRIT: 48.6 % (ref 40.0–52.0)
HEMOGLOBIN: 17 g/dL (ref 13.0–18.0)
MCH: 29.9 pg (ref 26.0–34.0)
MCHC: 34.9 g/dL (ref 32.0–36.0)
MCV: 85.9 fL (ref 80.0–100.0)
Platelets: 220 10*3/uL (ref 150–440)
RBC: 5.66 MIL/uL (ref 4.40–5.90)
RDW: 12.8 % (ref 11.5–14.5)
WBC: 4.8 10*3/uL (ref 3.8–10.6)

## 2017-08-06 IMAGING — CT CT CHEST W/O CM
2 of 4 series · 15 of 36 positions shown, 18 images · non-contrast
Comparison: Chest x-ray from same day.

CLINICAL DATA: Shortness of breath.

EXAM:
CT CHEST WITHOUT CONTRAST
TECHNIQUE: Multidetector CT imaging of the chest was performed following the
standard protocol without IV contrast.

[Series 2: thorax · axial · 0.73mm/px · z∈[-667,-399]mm · 12 of 158 slices shown, 15 images]
[im 12/158  mediastinal]
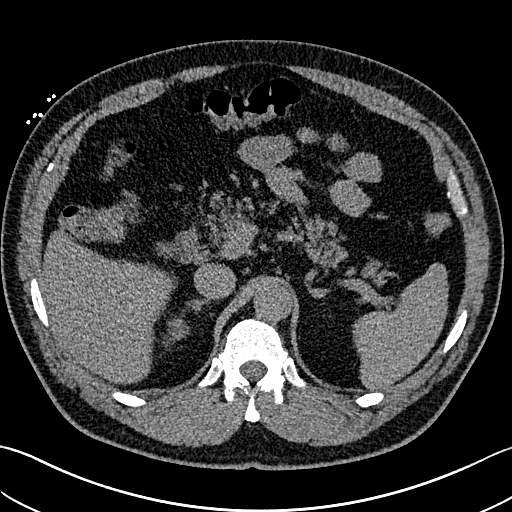
[im 12/158  lung]
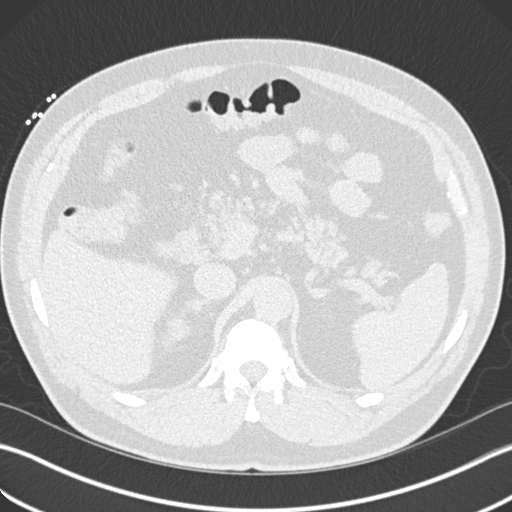
[im 23/158  lung]
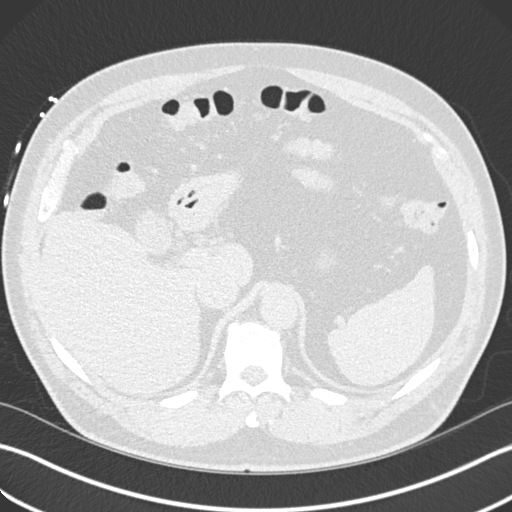
[im 34/158  lung]
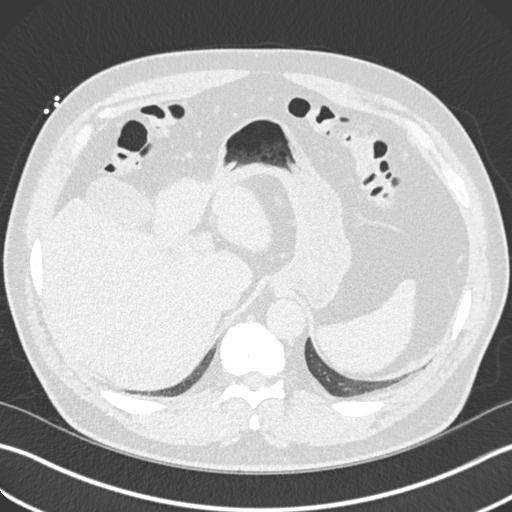
[im 45/158  lung]
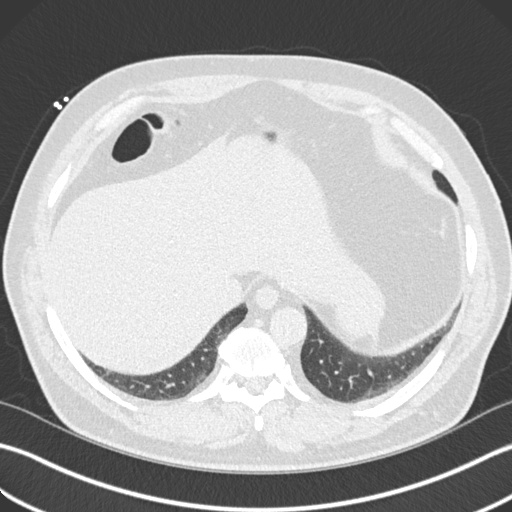
[im 57/158  mediastinal]
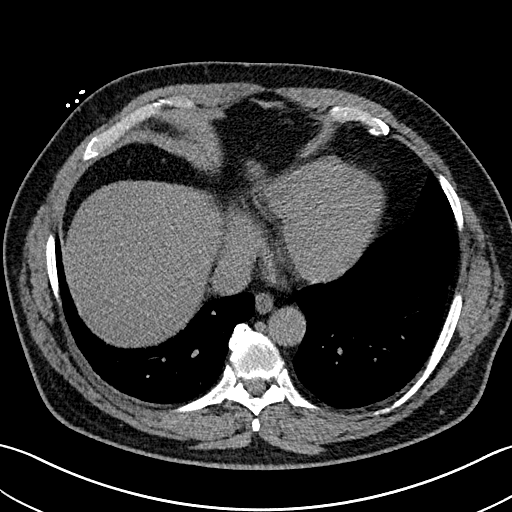
[im 57/158  lung]
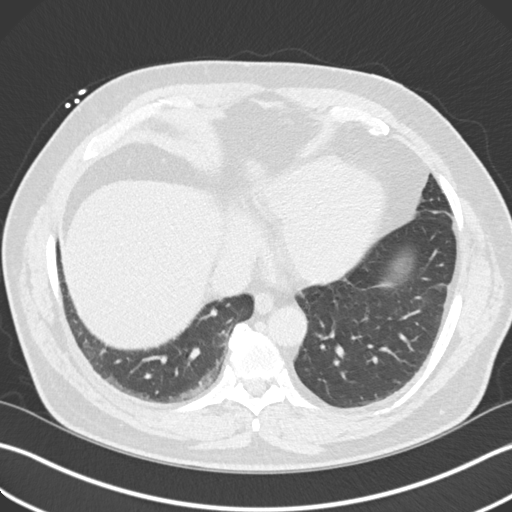
[im 68/158  lung]
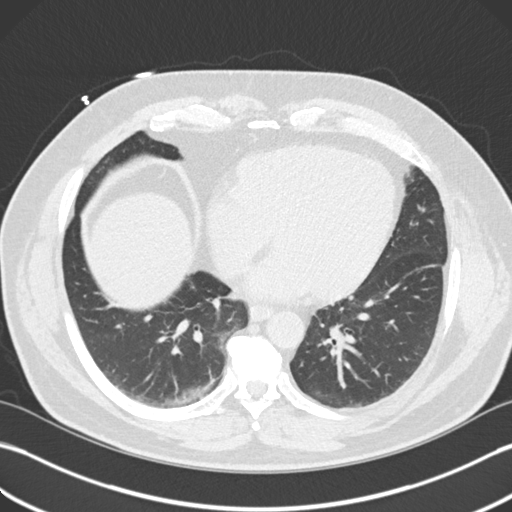
[im 90/158  lung]
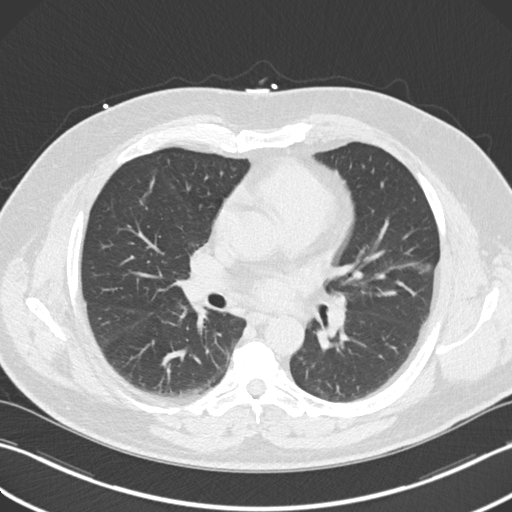
[im 101/158  lung]
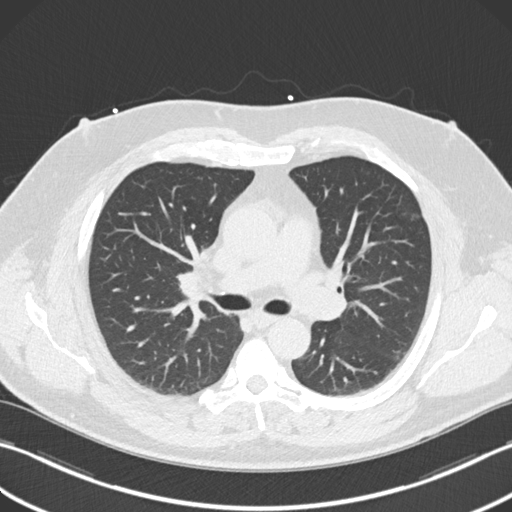
[im 113/158  mediastinal]
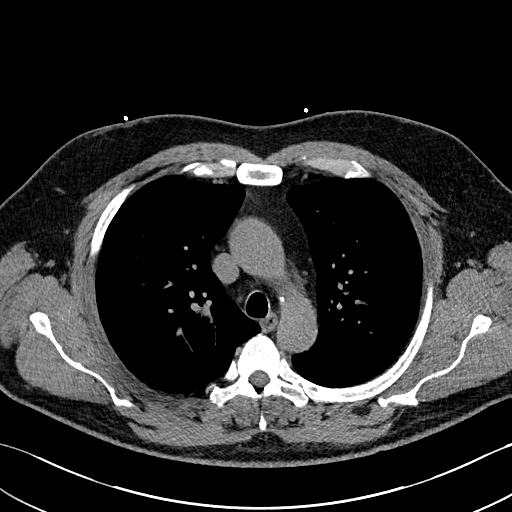
[im 113/158  lung]
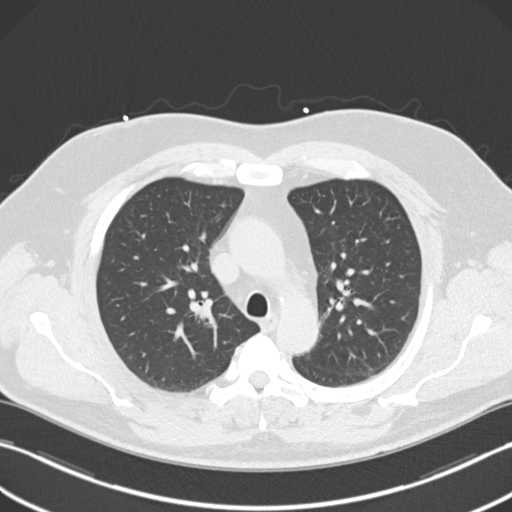
[im 124/158  lung]
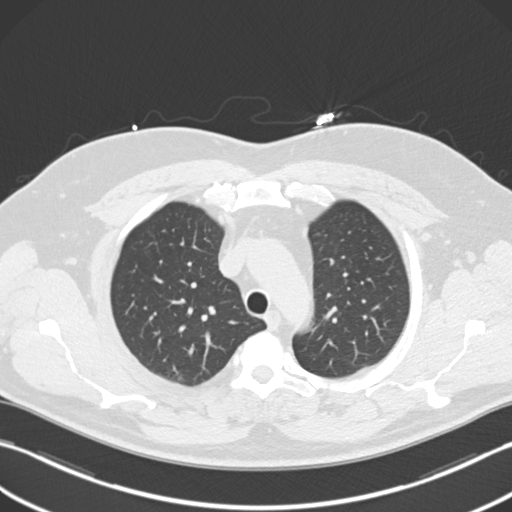
[im 135/158  lung]
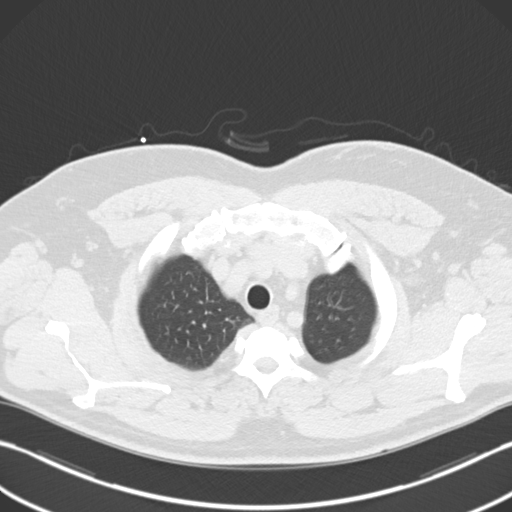
[im 146/158  lung]
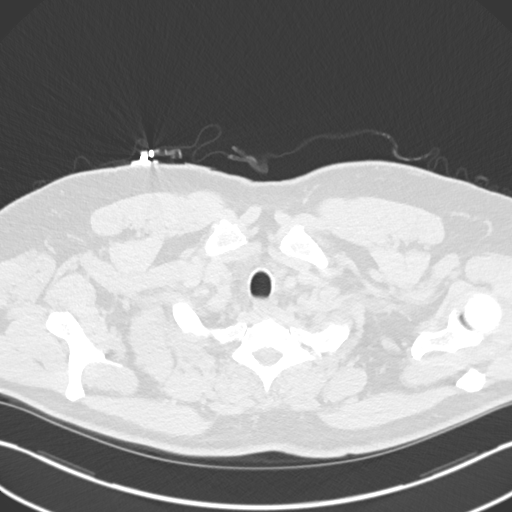

[Series 5: coronal · coronal · 0.65mm/px · 3 of 134 slices shown]
[im 27/134  lung]
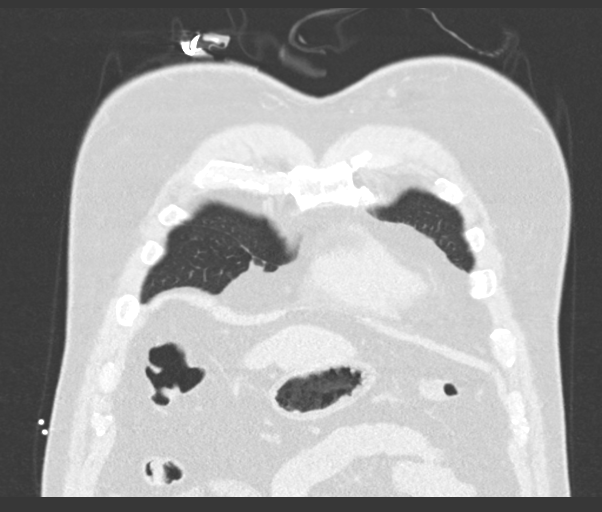
[im 54/134  lung]
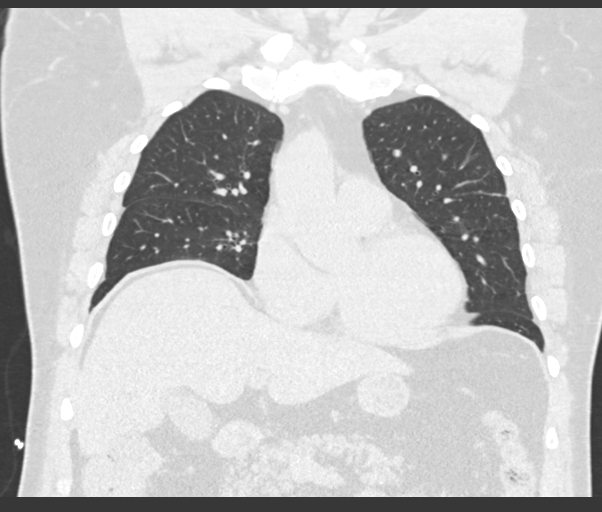
[im 80/134  lung]
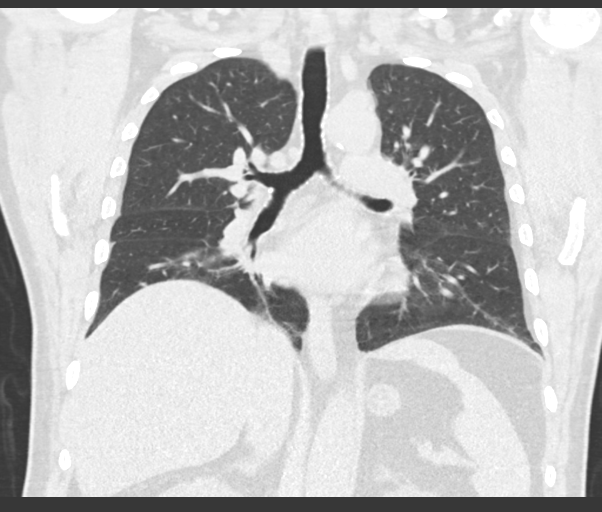

[15 of 36 positions shown; findings below may reference images not displayed]

FINDINGS: Cardiovascular: No significant vascular findings. Normal heart size.
No pericardial effusion. Normal size thoracic aorta. Minimal
atherosclerotic vascular calcifications of the aortic arch.

Mediastinum/Nodes: No enlarged mediastinal or axillary lymph nodes.
Thyroid gland, trachea, and esophagus demonstrate no significant
findings.

Lungs/Pleura: No suspicious pulmonary nodule. Tiny 3 mm nodule in
the left upper lobe (series 3, image 63). Punctate calcified
granuloma at the left lung base. Bibasilar atelectasis. Scattered
linear atelectasis/scarring, most prominent in the right middle
lobe. No pleural effusion or pneumothorax.

Upper Abdomen: No acute abnormality.

Musculoskeletal: No chest wall mass or suspicious bone lesions
identified. No fracture.
IMPRESSION: 1. No acute intrathoracic process. Density seen on chest x-ray
likely represents atelectasis/scarring in the lingula.

## 2017-08-06 IMAGING — CR DG CHEST 2V
2 series · 2 of 2 positions shown · non-contrast
Comparison: [DATE]

CLINICAL DATA: Left-sided chest tightness 3 days worse today with
some shortness-of-breath.

EXAM:
CHEST  2 VIEW

[chest pa]
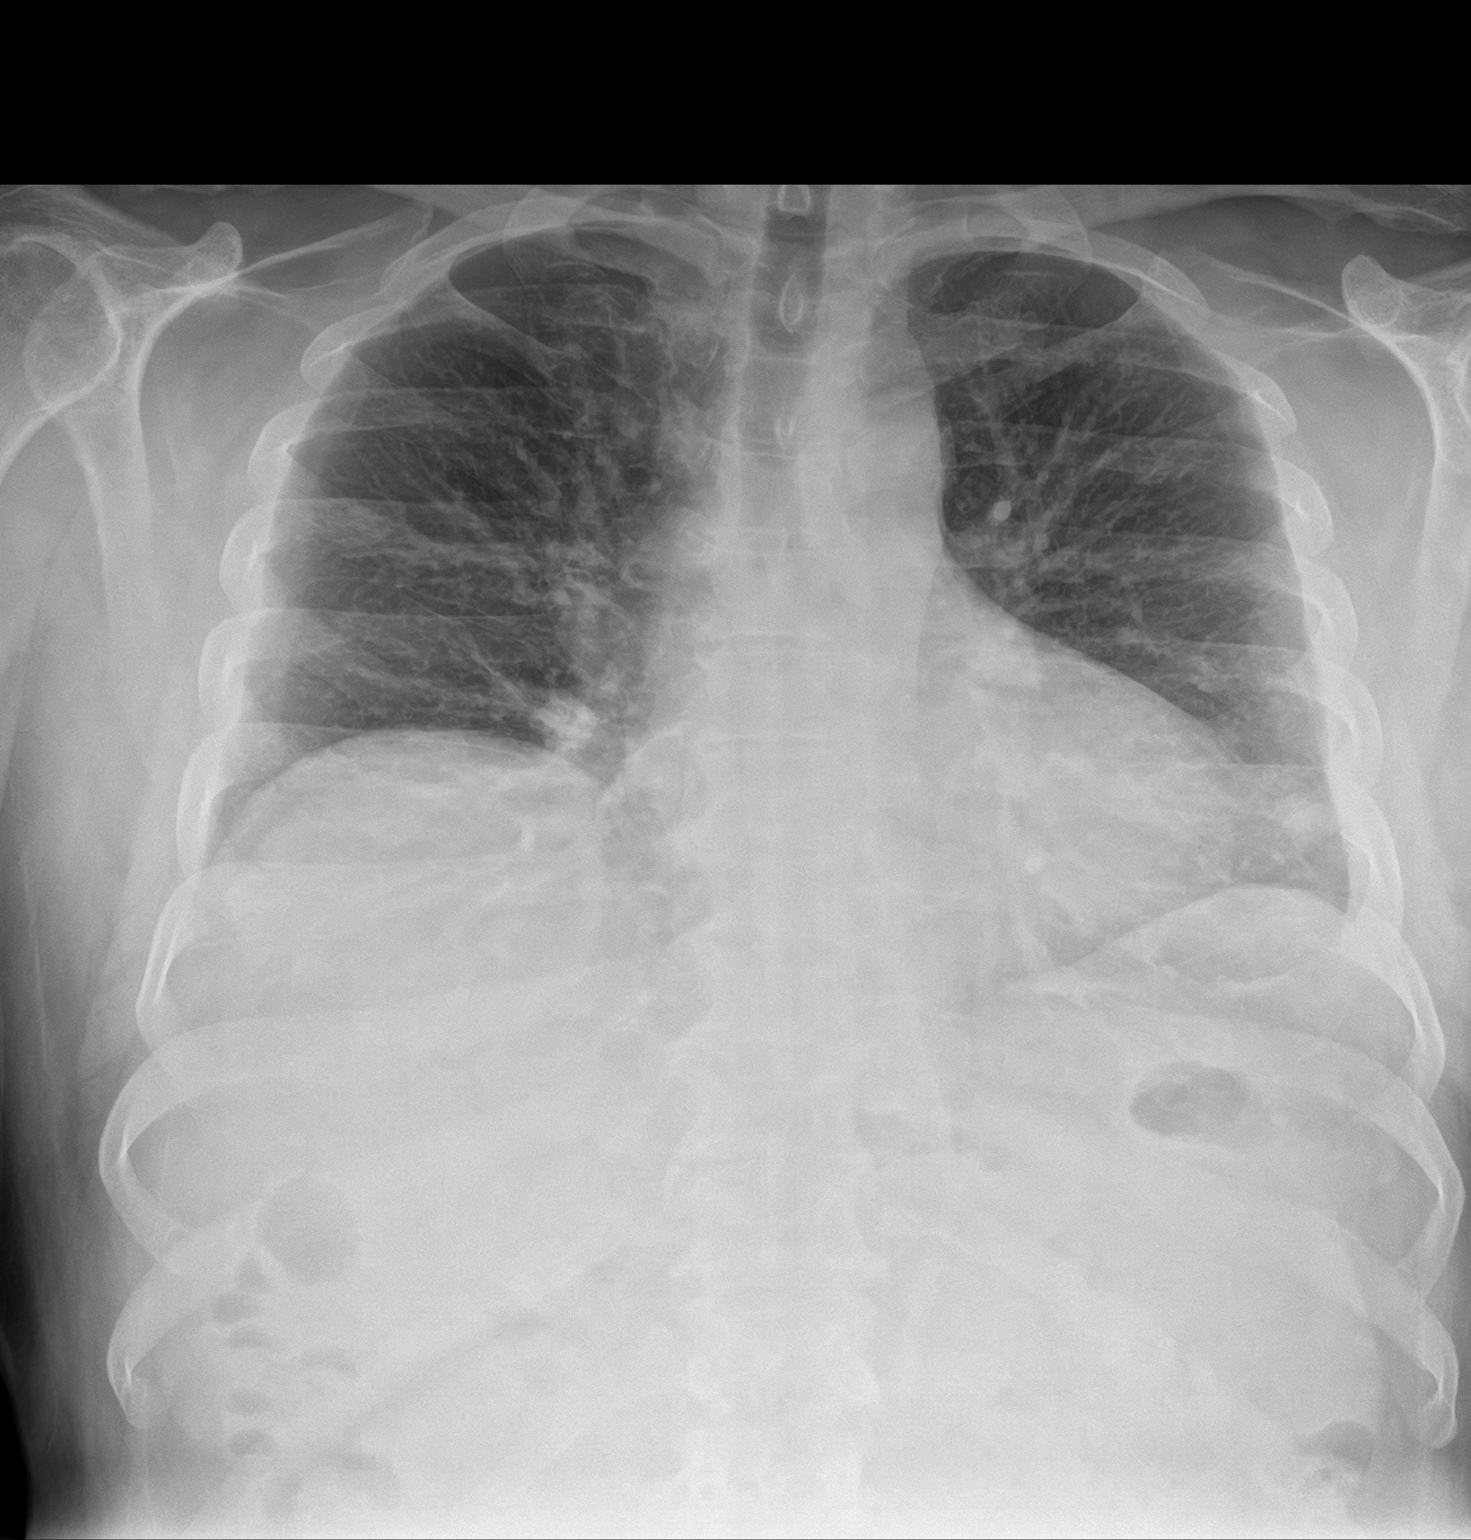

[chest lat]
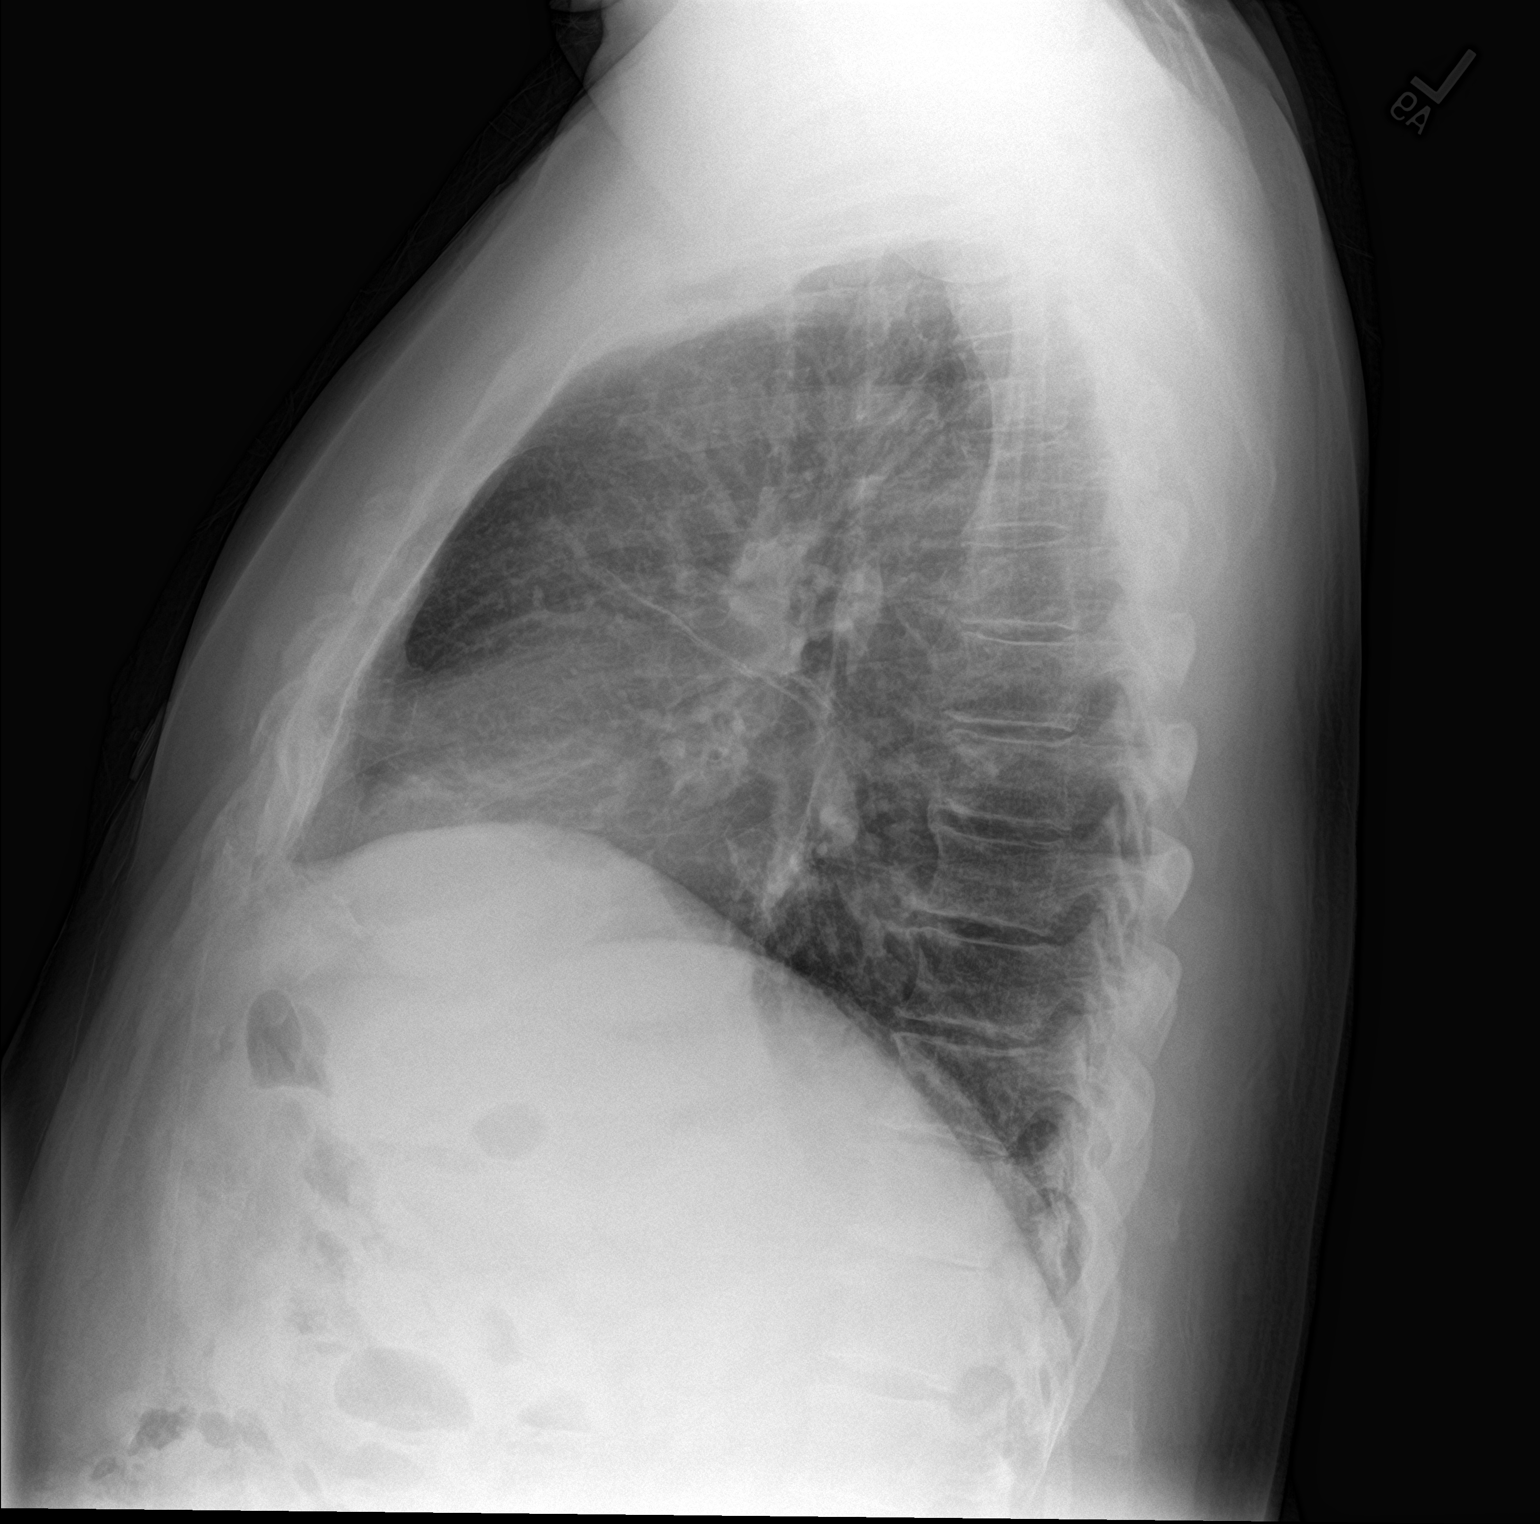

[2 of 2 positions shown; findings below may reference images not displayed]

FINDINGS: Lungs are hypoinflated without lobar consolidation, effusion or
pneumothorax. Focal density over the anterior left sixth rib end.
Cardiomediastinal silhouette is within normal. There is degenerative
change of the spine.
IMPRESSION: No acute cardiopulmonary disease.

Density projected over the left anterior sixth rib. May be chest
wall abnormality such as costochondral calcification versus
underlying pulmonary abnormality. Recommend noncontrast chest CT for
further evaluation.

## 2017-08-06 MED ORDER — IBUPROFEN 800 MG PO TABS: 800.0000 mg | ORAL_TABLET | Freq: Three times a day (TID) | ORAL | 0 refills | Status: DC | PRN

## 2017-08-06 MED ORDER — DIAZEPAM 5 MG PO TABS: 5.0000 mg | ORAL_TABLET | Freq: Three times a day (TID) | ORAL | 0 refills | Status: DC | PRN

## 2017-08-06 NOTE — Telephone Encounter (Signed)
Patient called stating he woke up this morning with tightness on the left side of his chest. He states for the past 3-4 days he has had a soreness in his chest, but this morning it worsened. Patient has also been slightly short of breath. He denies, blurred vision, numbness, headache or speech difficulty. I advised patient that he needs to go to the ER for evaluation. Patient verbalized understanding and agrees to go to the ER. I spoke with Dr. Sherrie Mustache, who agrees that patient should go to the ER.

## 2017-08-06 NOTE — ED Triage Notes (Signed)
Pt presents with left sided chest tightness for three days, worse today with some shortness of breath.

## 2017-08-06 NOTE — ED Provider Notes (Signed)
[facility] Emergency Department Provider Note       Time seen: ----------------------------------------- 9:48 AM on 08/06/2017 -----------------------------------------     I have reviewed the triage vital signs and the nursing notes.   HISTORY   Chief Complaint Chest Pain    HPI Reginald Simpson is a 61 y.o. male who presents to the ED for Chest pain. Patient describes chest discomfort that has been intermittent over the last 3 days. He does report some pain when he takes a deep breath and he has never had this before. He denies any sweats, nausea or other complaints. Patient does not report a family history or smoking history. He reports he has mild hyper lipidemia.   Past Medical History:  Diagnosis Date  . Colon polyps   . Hyperglycemia     Patient Active Problem List   Diagnosis Date Noted  . Hypothyroid 03/27/2016  . Obesity 03/27/2016  . Chest wall pain 03/27/2016  . Hyperglycemia 03/27/2016  . Situational stress 03/27/2016  . Family history of prostate cancer 06/17/2010  . Hematuria 01/22/2010  . History of colon polyps 02/04/2008    Past Surgical History:  Procedure Laterality Date  . BACK SURGERY  1994  . COLONOSCOPY  07/07/2013   Mild active colitis of Cecum, Hyperplastic polyp in rectum. [facility]  . SHOULDER ARTHROSCOPY Right 03/2017   Dr. Thomasena Edis,, [facility]    Allergies Patient has no known allergies.  Social History Social History  Substance Use Topics  . Smoking status: Never Smoker  . Smokeless tobacco: Never Used  . Alcohol use 0.0 oz/week     Comment: 2-3 beers or glasses of wine 3 night each week   Review of Systems Constitutional: Negative for fever. Eyes: Negative for vision changes ENT:  Negative for congestion, sore throat Cardiovascular: positive for chest discomfort Respiratory: Negative for shortness of breath. Gastrointestinal: Negative for abdominal pain, vomiting  and diarrhea. Musculoskeletal: Negative for back pain. Skin: Negative for rash. Neurological: Negative for headaches, focal weakness or numbness.  All systems negative/normal/unremarkable except as stated in the HPI  ____________________________________________   PHYSICAL EXAM:  VITAL SIGNS: ED Triage Vitals [08/06/17 0931]  Enc Vitals Group     BP      Pulse      Resp      Temp      Temp src      SpO2      Weight 215 lb (97.5 kg)     Height      Head Circumference      Peak Flow      Pain Score 5     Pain Loc      Pain Edu?      Excl. in GC?    Constitutional: Alert and oriented. Well appearing and in no distress. Eyes: Conjunctivae are normal. Normal extraocular movements. ENT   Head: Normocephalic and atraumatic.   Nose: No congestion/rhinnorhea.   Mouth/Throat: Mucous membranes are moist.   Neck: No stridor. Cardiovascular: Normal rate, regular rhythm. No murmurs, rubs, or gallops. Respiratory: Normal respiratory effort without tachypnea nor retractions. Breath sounds are clear and equal bilaterally. No wheezes/rales/rhonchi. Gastrointestinal: Soft and nontender. Normal bowel sounds Musculoskeletal: Nontender with normal range of motion in extremities. No lower extremity tenderness nor edema. Neurologic:  Normal speech and language. No gross focal neurologic deficits are appreciated.  Skin:  Skin is warm, dry and intact. No rash noted. Psychiatric: Mood and affect are normal. Speech and behavior are  normal.  ____________________________________________  EKG: Interpreted by me.sinus bradycardia with rate of 54 bpm, normal pr interval, normal QRS, normal QT.  ____________________________________________  ED COURSE:  Pertinent labs & imaging results that were available during my care of the patient were reviewed by me and considered in my medical decision making (see chart for details). Patient presents for chest discomfort, we will assess with labs  and imaging as indicated.   Procedures ____________________________________________   LABS (pertinent positives/negatives)  Labs Reviewed  BASIC METABOLIC PANEL - Abnormal; Notable for the following:       Result Value   Glucose, Bld 100 (*)    All other components within normal limits  CBC  TROPONIN I  TROPONIN I    RADIOLOGY Images were viewed by me  chest x-ray IMPRESSION: No acute cardiopulmonary disease.  Density projected over the left anterior sixth rib. May be chest wall abnormality such as costochondral calcification versus underlying pulmonary abnormality. Recommend noncontrast chest CT for further evaluation. IMPRESSION: 1. No acute intrathoracic process. Density seen on chest x-ray likely represents atelectasis/scarring in the lingula. ____________________________________________  FINAL ASSESSMENT AND PLAN  chest pain   Plan: Patient's labs and imaging were dictated above. Patient had presented for chest pain which seems to be musculoskeletal in origin. workup here was negative including repeat troponin. He'll be discharged with NSAIDs and muscle relaxants and is stable for outpatient follow-up.   Emily Filbert, MD   Note: This note was generated in part or whole with voice recognition software. Voice recognition is usually quite accurate but there are transcription errors that can and very often do occur. I apologize for any typographical errors that were not detected and corrected.     Emily Filbert, MD 08/06/17 1255

## 2017-08-28 ENCOUNTER — Ambulatory Visit (INDEPENDENT_AMBULATORY_CARE_PROVIDER_SITE_OTHER): Payer: PRIVATE HEALTH INSURANCE | Admitting: Family Medicine

## 2017-08-28 ENCOUNTER — Encounter: Payer: Self-pay | Admitting: Family Medicine

## 2017-08-28 VITALS — BP 142/90 | HR 53 | Temp 97.7°F | Resp 16 | Wt 221.0 lb

## 2017-08-28 DIAGNOSIS — E039 Hypothyroidism, unspecified: Secondary | ICD-10-CM

## 2017-08-28 LAB — TSH: TSH: 4.79 mIU/L — ABNORMAL HIGH (ref 0.40–4.50)

## 2017-08-28 LAB — T4, FREE: Free T4: 1.2 ng/dL (ref 0.8–1.8)

## 2017-08-28 NOTE — Progress Notes (Signed)
Patient: Reginald Simpson Male    DOB: 1956-03-25   61 y.o.   MRN: [id number] Visit Date: 08/28/2017  Today's Provider: [name], MD   Chief Complaint  Patient presents with  . Hypothyroidism    follow up  . Hyperlipidemia    follow up   Subjective:    HPI Follow up of Hypothyroidism:  Patient was last seen for this problem 3 months ago. Changes made during that visit includes starting Levothyroxine daily. Patient reports good compliance with treatment and good tolerance. States he still feels fatigued and is not doing any better losing weight. Does not get much exercise.     Lab Results  Component Value Date   TSH 5.710 (H) 05/27/2017   Wt Readings from Last 4 Encounters:  08/28/17 221 lb (100.2 kg)  08/06/17 215 lb (97.5 kg)  05/27/17 224 lb (101.6 kg)  03/31/16 215 lb (97.5 kg)    BP Readings from Last 5 Encounters:  08/28/17 (!) 142/90  08/06/17 (!) 150/89  05/27/17 128/82  03/31/16 120/78  03/28/15 136/80     Lipid/Cholesterol, Follow-up:   Last seen for this 3 months ago.  Management changes since that visit include non; patient was counseled to avoid saturated fats and dairy in his diet. . Last Lipid Panel:    Component Value Date/Time   CHOL 243 (H) 05/27/2017 1101   TRIG 203 (H) 05/27/2017 1101   HDL 55 05/27/2017 1101   CHOLHDL 4.4 05/27/2017 1101   LDLCALC 147 (H) 05/27/2017 1101    Risk factors for vascular disease include hypercholesterolemia  He reports good compliance with treatment. He is not having side effects.  Current symptoms include none and have been stable. Weight trend: stable Prior visit with dietician: no Current diet: well balanced Current exercise: none  Wt Readings from Last 3 Encounters:  08/06/17 215 lb (97.5 kg)  05/27/17 224 lb (101.6 kg)  03/31/16 215 lb (97.5 kg)    -------------------------------------------------------------------     No Known Allergies   Current Outpatient  Prescriptions:  .  aspirin 81 MG tablet, Take 81 mg by mouth daily., Disp: , Rfl:  .  levothyroxine (SYNTHROID, LEVOTHROID) 75 MCG tablet, Take 1 tablet (75 mcg total) by mouth daily., Disp: 30 tablet, Rfl: 3  Review of Systems  Constitutional: Negative for appetite change, chills and fever.  Respiratory: Negative for chest tightness, shortness of breath and wheezing.   Cardiovascular: Negative for chest pain and palpitations.  Gastrointestinal: Negative for abdominal pain, nausea and vomiting.    Social History  Substance Use Topics  . Smoking status: Never Smoker  . Smokeless tobacco: Never Used  . Alcohol use 0.0 oz/week     Comment: 2-3 beers or glasses of wine 3 night each week   Objective:   BP (!) 142/90 (BP Location: Left Arm, Cuff Size: Large)   Pulse (!) 53   Temp 97.7 F (36.5 C) (Oral)   Resp 16   Wt 221 lb (100.2 kg)   SpO2 97% Comment: room air  BMI 33.60 kg/m  Vitals:   08/28/17 0834 08/28/17 0837  BP: (!) 152/90 (!) 142/90  Pulse: (!) 53   Resp: 16   Temp: 97.7 F (36.5 C)   TempSrc: Oral   SpO2: 97%   Weight: 221 lb (100.2 kg)      Physical Exam  General Appearance:    Alert, cooperative, no distress, obese  Eyes:    PERRL, conjunctiva/corneas clear, EOM's  intact       Lungs:     Clear to auscultation bilaterally, respirations unlabored  Heart:    Regular rate and rhythm  Neurologic:   Awake, alert, oriented x 3. No apparent focal neurological           defect.           Assessment & Plan:     1. Hypothyroidism, unspecified type Tolerating levothyroxine well, but no change in energy and still gaining wt.  - TSH - T4, free       [name], MD  [facility]

## 2017-08-31 ENCOUNTER — Telehealth: Payer: Self-pay | Admitting: *Deleted

## 2017-08-31 MED ORDER — LEVOTHYROXINE SODIUM 75 MCG PO TABS: 75.0000 ug | ORAL_TABLET | Freq: Every day | ORAL | 3 refills | Status: DC

## 2017-08-31 NOTE — Telephone Encounter (Signed)
-----   Message from Malva Limes, MD sent at 08/30/2017  8:46 PM EDT ----- Still hypothyroid, but better, need to increase levothyroxine to one tablet daily, #30, rf x 3 and follow up in 3 months.

## 2017-08-31 NOTE — Telephone Encounter (Signed)
Patient was notified. Patient will pick-up printed rx.

## 2017-09-22 ENCOUNTER — Other Ambulatory Visit: Payer: Self-pay | Admitting: Family Medicine

## 2017-09-30 ENCOUNTER — Other Ambulatory Visit: Payer: Self-pay | Admitting: Family Medicine

## 2017-09-30 MED ORDER — LEVOTHYROXINE SODIUM 100 MCG PO TABS: 100.0000 ug | ORAL_TABLET | Freq: Every day | ORAL | 3 refills | Status: DC

## 2017-09-30 NOTE — Telephone Encounter (Signed)
Sent rx to pharmacy.forgot to send levothyroxine 08/31/2017.

## 2017-09-30 NOTE — Telephone Encounter (Signed)
Pt stated that after his LOV on 08/28/17 he received a call on 08/31/17 advising him that his thyroid medication dose was going up to levothyroxine (SYNTHROID, LEVOTHROID) 100 MCG tablet instead of 75 mcg. The Rx that was sent to [facility] on 09/22/17 was for 75 mcg and pt is requesting it to be changed to 100 mcg because he has been dividing the 75 mcg tablets so that he was taking 100 mcg since 08/31/17. Please advise. Thanks TNP

## 2017-11-28 ENCOUNTER — Other Ambulatory Visit: Payer: Self-pay | Admitting: Family Medicine

## 2017-12-01 ENCOUNTER — Ambulatory Visit: Payer: Self-pay | Admitting: Family Medicine

## 2017-12-01 ENCOUNTER — Encounter: Payer: Self-pay | Admitting: Family Medicine

## 2017-12-01 VITALS — BP 128/78 | HR 49 | Temp 97.9°F | Resp 16 | Wt 202.0 lb

## 2017-12-01 DIAGNOSIS — E039 Hypothyroidism, unspecified: Secondary | ICD-10-CM

## 2017-12-01 NOTE — Patient Instructions (Signed)
   The CDC recommends two doses of Shingrix (the shingles vaccine) separated by 2 to 6 months for adults age 61 years and older. I recommend checking with your insurance plan regarding coverage for this vaccine.    I recommend that you get a flu vaccine this year. Please call our office at [phone] at your earliest convenience to schedule a flu shot.   

## 2017-12-01 NOTE — Progress Notes (Signed)
       Patient: Reginald Simpson Male    DOB: [birth year]   62 y.o.   MRN: [id number] Visit Date: 12/01/2017  Today's Provider: Mila Merryonald Adama Ivins, MD   Chief Complaint  Patient presents with  . Hypothyroidism    follow up  . Hyperlipidemia    follow up   Subjective:    HPI Follow up of Hypothyroidism:  Patient was last seen for this problem 3 months ago. Changes made during that visit includes increasing Levothyroxine to 100mcg daily. Patient reports good compliance with treatment and good tolerance. He feels more energetic since dose was increased, is exercising regularly. Has los about 20 pounds since October. Is sleeping well with no adverse effects of medications.      Wt Readings from Last 3 Encounters:  12/01/17 202 lb (91.6 kg)  08/28/17 221 lb (100.2 kg)  08/06/17 215 lb (97.5 kg)    -------------------------------------------------------------------      No Known Allergies   Current Outpatient Medications:  .  aspirin 81 MG tablet, Take 81 mg by mouth daily., Disp: , Rfl:  .  levothyroxine (SYNTHROID, LEVOTHROID) 100 MCG tablet, Take 1 tablet (100 mcg total) daily by mouth., Disp: 30 tablet, Rfl: 3  Review of Systems  Constitutional: Negative for appetite change, chills and fever.  Respiratory: Negative for chest tightness, shortness of breath and wheezing.   Cardiovascular: Negative for chest pain and palpitations.  Gastrointestinal: Negative for abdominal pain, nausea and vomiting.    Social History   Tobacco Use  . Smoking status: Never Smoker  . Smokeless tobacco: Never Used  Substance Use Topics  . Alcohol use: Yes    Alcohol/week: 0.0 oz    Comment: 2-3 beers or glasses of wine 3 night each week   Objective:   BP 128/78 (BP Location: Left Arm, Patient Position: Sitting, Cuff Size: Large)   Pulse (!) 49   Temp 97.9 F (36.6 C) (Oral)   Resp 16   Wt 202 lb (91.6 kg)   SpO2 96% Comment: room air  BMI 30.71 kg/m  There were no vitals filed for  this visit.   Physical Exam  General appearance: alert, well developed, well nourished, cooperative and in no distress Head: Normocephalic, without obvious abnormality, atraumatic Respiratory: Respirations even and unlabored, normal respiratory rate Extremities: No gross deformities Skin: Skin color, texture, turgor normal. No rashes seen  Psych: Appropriate mood and affect. Neurologic: Mental status: Alert, oriented to person, place, and time, thought content appropriate.     Assessment & Plan:     1. Hypothyroidism, unspecified type Feels better and losing weight since most recent levothyroxine dose change. Will refill if labs show he is euthyroid.  - TSH       Mila Merryonald Fayetta Sorenson, MD  [facility]

## 2017-12-02 LAB — TSH: TSH: 2.12 u[IU]/mL (ref 0.450–4.500)

## 2018-04-14 ENCOUNTER — Encounter: Payer: Self-pay | Admitting: Family Medicine

## 2018-04-14 ENCOUNTER — Ambulatory Visit
Admission: RE | Admit: 2018-04-14 | Discharge: 2018-04-14 | Disposition: A | Discharge status: Home / Self Care | Payer: No Typology Code available for payment source | Source: Ambulatory Visit | Attending: Family Medicine | Admitting: Family Medicine

## 2018-04-14 ENCOUNTER — Ambulatory Visit (INDEPENDENT_AMBULATORY_CARE_PROVIDER_SITE_OTHER): Payer: Self-pay | Admitting: Family Medicine

## 2018-04-14 VITALS — BP 140/80 | HR 53 | Temp 97.7°F | Resp 16 | Wt 194.0 lb

## 2018-04-14 DIAGNOSIS — R202 Paresthesia of skin: Secondary | ICD-10-CM

## 2018-04-14 DIAGNOSIS — R200 Anesthesia of skin: Secondary | ICD-10-CM | POA: Diagnosis not present

## 2018-04-14 IMAGING — CR DG THORACIC SPINE 3V
1 series · 3 of 3 positions shown · non-contrast
Comparison: CT chest [DATE]

CLINICAL DATA: Numbness and tingling

EXAM:
THORACIC SPINE - 3 VIEWS

[Series 1: dg thoracic spine w/swimmers · 0.14mm/px · 3 of 3 slices shown]
[im 1/3]
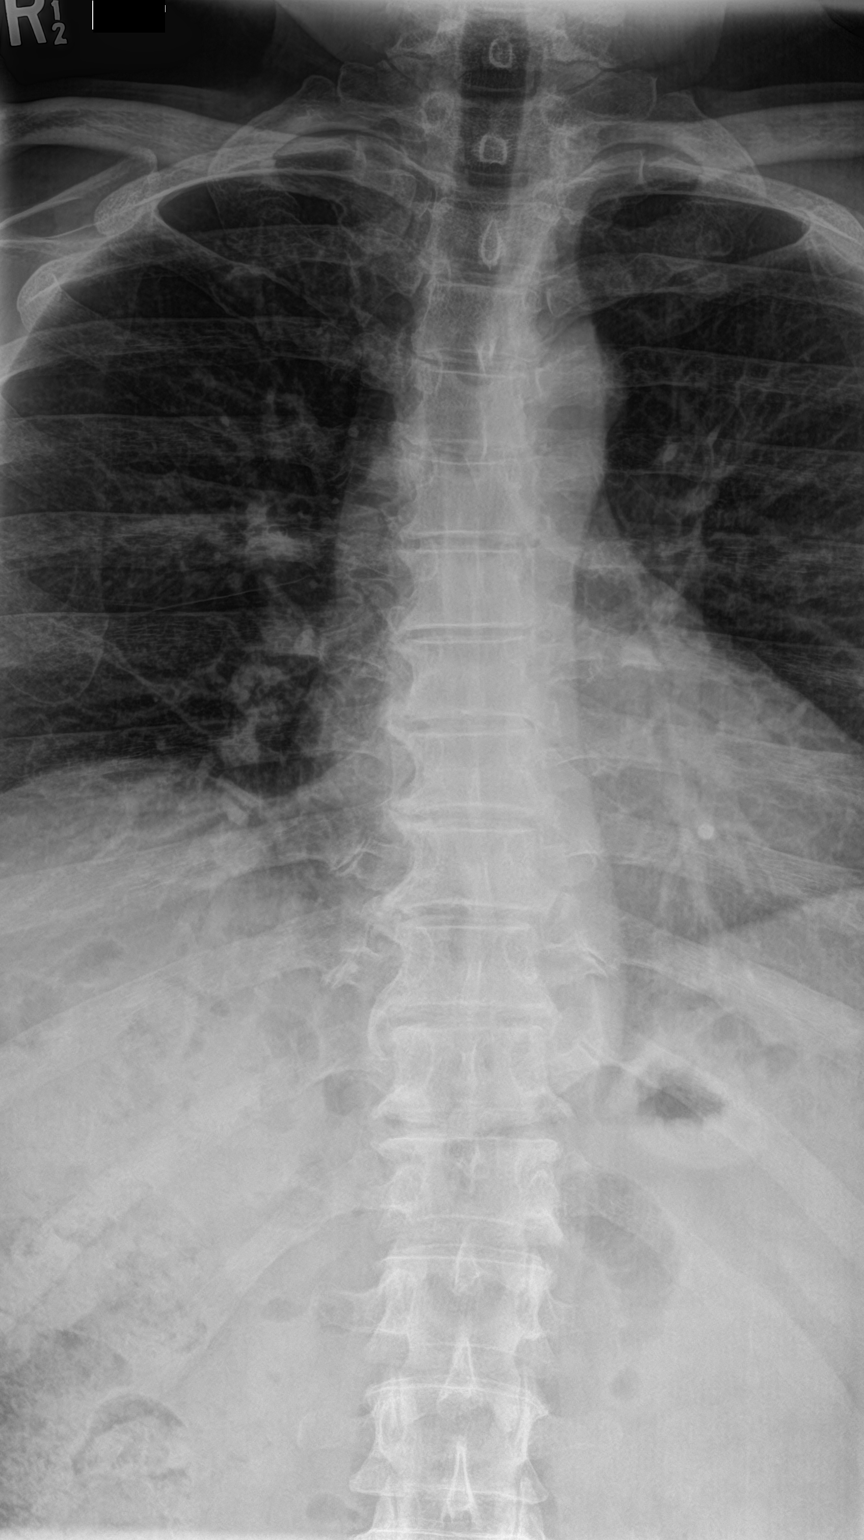
[im 2/3]
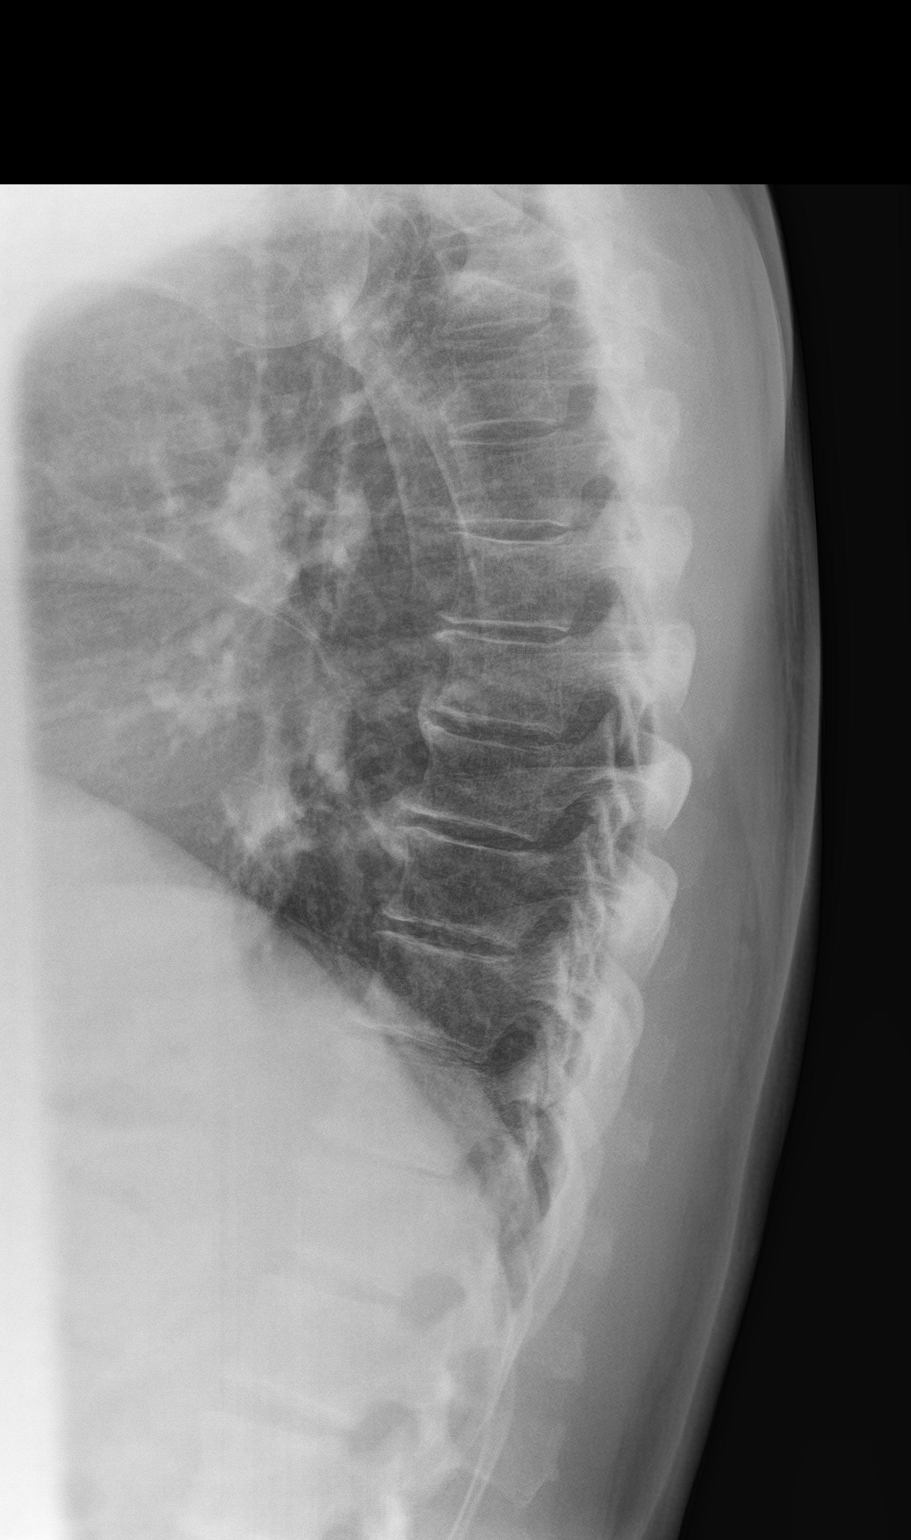
[im 3/3]
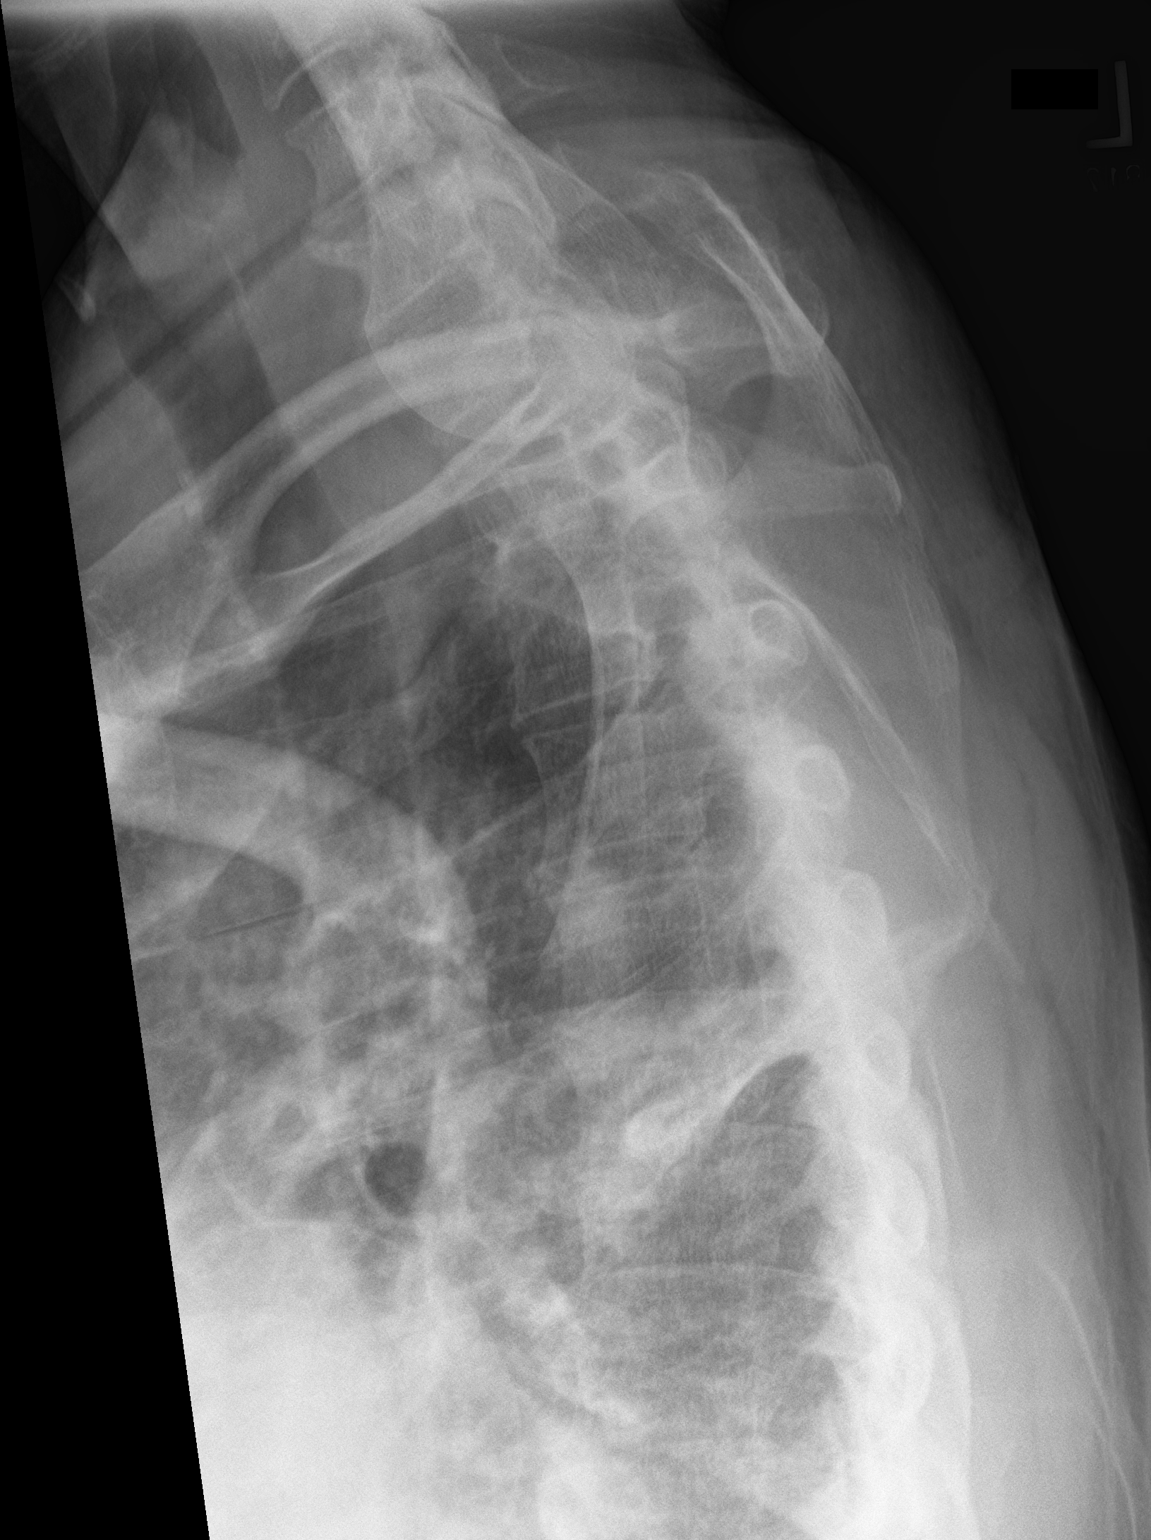

[3 of 3 positions shown; findings below may reference images not displayed]

FINDINGS: Normal thoracic alignment. Negative for fracture or mass. Mild disc
degeneration and mild spurring midthoracic spine.
IMPRESSION: No acute abnormality.

## 2018-04-14 NOTE — Patient Instructions (Signed)
Go to the [facility] on [facility] [facility] for thoracic back Xray

## 2018-04-14 NOTE — Progress Notes (Signed)
       Patient: Reginald Simpson Male    DOB: [birth year]   62 y.o.   MRN: [id number] Visit Date: 04/14/2018  Today's Provider: [name], MD   Chief Complaint  Patient presents with  . Numbness    x 1 month   Subjective:    HPI Numbness: Patient reports that he has had numbness and tingling on the left side of his back for the past 4-6 weeks. He reports this problem has occurred intermittently and has been unchanged. Patient denies any rash, pain, shortness of breath or chest pain. Happens several times a day, but only lasts for 5-10 seconds with no residual symptoms. No pain. Not related to activity or positioning. No clear triggers, does not wake at night. No known injuries.     No Known Allergies   Current Outpatient Medications:  .  aspirin 81 MG tablet, Take 81 mg by mouth daily., Disp: , Rfl:  .  levothyroxine (SYNTHROID, LEVOTHROID) 100 MCG tablet, TAKE 1 TABLET (100 MCG TOTAL) DAILY BY MOUTH., Disp: 30 tablet, Rfl: 11  Review of Systems  Constitutional: Negative for appetite change, chills and fever.  Respiratory: Negative for chest tightness, shortness of breath and wheezing.   Cardiovascular: Negative for chest pain and palpitations.  Gastrointestinal: Negative for abdominal pain, nausea and vomiting.  Neurological: Positive for numbness (on left side of back).    Social History   Tobacco Use  . Smoking status: Never Smoker  . Smokeless tobacco: Never Used  Substance Use Topics  . Alcohol use: Yes    Alcohol/week: 0.0 oz    Comment: 2-3 beers or glasses of wine 3 night each week   Objective:   BP 140/80 (BP Location: Left Arm, Patient Position: Sitting, Cuff Size: Large)   Pulse (!) 53   Temp 97.7 F (36.5 C) (Oral)   Resp 16   Wt 194 lb (88 kg)   SpO2 94% Comment: room air  BMI 29.50 kg/m  Vitals:   04/14/18 0828  BP: 140/80  Pulse: (!) 53  Resp: 16  Temp: 97.7 F (36.5 C)  TempSrc: Oral  SpO2: 94%  Weight: 194 lb (88 kg)    Physical  Exam  General appearance: alert, well developed, well nourished, cooperative and in no distress Head: Normocephalic, without obvious abnormality, atraumatic Respiratory: Respirations even and unlabored, normal respiratory rate MS: he indicates area just to left of T6-T7 vetebra. No tenderness, no swelling. No redness, no gross deformities or area.      Assessment & Plan:     1. Numbness and tingling Suspect nerve entrapment. Patient mainly wants to make sure it is not a symptoms of something more serious.Marland Kitchendoesn't feel like he needs medication for it - DG Thoracic Spine W/Swimmers; Future       [name], MD  [facility]

## 2018-04-15 ENCOUNTER — Telehealth: Payer: Self-pay | Admitting: Family Medicine

## 2018-04-15 NOTE — Telephone Encounter (Signed)
Patient is calling to get xray results.  Please advise.

## 2018-04-15 NOTE — Telephone Encounter (Signed)
Patient was notified of results. Expressed understanding.  

## 2019-02-12 ENCOUNTER — Other Ambulatory Visit: Payer: Self-pay | Admitting: Family Medicine

## 2019-03-18 HISTORY — PX: SHOULDER ARTHROSCOPY: SHX128

## 2019-06-29 ENCOUNTER — Encounter: Payer: Self-pay | Admitting: Family Medicine

## 2019-07-11 ENCOUNTER — Other Ambulatory Visit: Payer: Self-pay

## 2019-07-11 ENCOUNTER — Ambulatory Visit (INDEPENDENT_AMBULATORY_CARE_PROVIDER_SITE_OTHER): Payer: Self-pay | Admitting: Family Medicine

## 2019-07-11 ENCOUNTER — Encounter: Payer: Self-pay | Admitting: Family Medicine

## 2019-07-11 VITALS — BP 148/79 | HR 58 | Temp 96.8°F | Ht 68.0 in | Wt 208.0 lb

## 2019-07-11 DIAGNOSIS — E039 Hypothyroidism, unspecified: Secondary | ICD-10-CM

## 2019-07-11 DIAGNOSIS — Z125 Encounter for screening for malignant neoplasm of prostate: Secondary | ICD-10-CM

## 2019-07-11 DIAGNOSIS — R739 Hyperglycemia, unspecified: Secondary | ICD-10-CM

## 2019-07-11 DIAGNOSIS — Z0000 Encounter for general adult medical examination without abnormal findings: Secondary | ICD-10-CM

## 2019-07-11 DIAGNOSIS — Z23 Encounter for immunization: Secondary | ICD-10-CM

## 2019-07-11 DIAGNOSIS — Z86010 Personal history of colonic polyps: Secondary | ICD-10-CM

## 2019-07-11 DIAGNOSIS — R5383 Other fatigue: Secondary | ICD-10-CM

## 2019-07-11 NOTE — Patient Instructions (Addendum)
.   Please review the attached list of medications and notify my office if there are any errors.   . Please bring all of your medications to every appointment so we can make sure that our medication list is the same as yours.   . It is especially important to get the annual flu vaccine this year. If you haven't had it already, please go to your pharmacy or call the office as soon as possible to schedule you flu shot.   The CDC recommends two doses of Shingrix (the shingles vaccine) separated by 2 to 6 months for adults age 2 years and older. I recommend checking with your insurance plan regarding coverage for this vaccine.   . Please go to the lab draw station in [address] on the second floor of [facility]  when you are fasting for 8 hours. Normal hours are 8:00am to 12:30pm and 1:30pm to 4:00pm Monday through Friday

## 2019-07-11 NOTE — Progress Notes (Signed)
Patient: Reginald Simpson, Male    DOB: 1955/12/17, 62 y.o.   MRN: [id number] Visit Date: 07/11/2019  Today's Provider: Lelon Huh, MD   Chief Complaint  Patient presents with  . Annual Exam   Subjective:     Annual physical exam MICKEAL DAWS is a 63 y.o. male who presents today for health maintenance and complete physical. He feels fairly well.  Pt reports feeling fatigued He reports exercising regularly. He reports he is sleeping poorly.   Wt Readings from Last 3 Encounters:  07/11/19 208 lb (94.3 kg)  04/14/18 194 lb (88 kg)  12/01/17 202 lb (91.6 kg)    -----------------------------------------------------------------   Review of Systems  Constitutional: Positive for fatigue. Negative for activity change, appetite change, chills, diaphoresis, fever and unexpected weight change.  HENT: Negative.   Eyes: Negative.   Respiratory: Negative.   Cardiovascular: Negative.   Gastrointestinal: Negative.   Genitourinary: Positive for hematuria. Negative for decreased urine volume, difficulty urinating, discharge, dysuria, enuresis, flank pain, frequency, genital sores, penile pain, penile swelling, scrotal swelling, testicular pain and urgency.  Musculoskeletal: Negative.   Skin: Negative.   Allergic/Immunologic: Negative.   Neurological: Negative.   Hematological: Negative.   Psychiatric/Behavioral: Positive for decreased concentration and dysphoric mood. Negative for agitation, behavioral problems, confusion, hallucinations, self-injury, sleep disturbance and suicidal ideas. The patient is not nervous/anxious and is not hyperactive.     Social History      He  reports that he has never smoked. He has never used smokeless tobacco. He reports current alcohol use. He reports that he does not use drugs.       Social History   Socioeconomic History  . Marital status: Married    Spouse name: Not on file  . Number of children: 3  . Years of education: Not on file  .  Highest education level: Not on file  Occupational History  . Occupation: Self employed  Social Needs  . Financial resource strain: Not on file  . Food insecurity    Worry: Not on file    Inability: Not on file  . Transportation needs    Medical: Not on file    Non-medical: Not on file  Tobacco Use  . Smoking status: Never Smoker  . Smokeless tobacco: Never Used  Substance and Sexual Activity  . Alcohol use: Yes    Alcohol/week: 0.0 standard drinks    Comment: 2-3 beers or glasses of wine 3 night each week  . Drug use: No  . Sexual activity: Not on file  Lifestyle  . Physical activity    Days per week: Not on file    Minutes per session: Not on file  . Stress: Not on file  Relationships  . Social Herbalist on phone: Not on file    Gets together: Not on file    Attends religious service: Not on file    Active member of club or organization: Not on file    Attends meetings of clubs or organizations: Not on file    Relationship status: Not on file  Other Topics Concern  . Not on file  Social History Narrative  . Not on file    Past Medical History:  Diagnosis Date  . Colon polyps   . Hyperglycemia      Patient Active Problem List   Diagnosis Date Noted  . Hypothyroid 03/27/2016  . Obesity 03/27/2016  . Chest wall pain 03/27/2016  .  Hyperglycemia 03/27/2016  . Situational stress 03/27/2016  . Family history of prostate cancer 06/17/2010  . Hematuria 01/22/2010  . History of colon polyps 02/04/2008    Past Surgical History:  Procedure Laterality Date  . BACK SURGERY  1994   Ruptured lumbar disk  . COLONOSCOPY  07/07/2013   Mild active colitis of Cecum, Hyperplastic polyp in rectum[facility]  . SHOULDER ARTHROSCOPY Right 03/2017   Dr. Thomasena Edisollins,, [facility]  . SHOULDER ARTHROSCOPY Left 03/2019    Family History        Family Status  Relation Name Status  . Mother  Alive  . Father  Deceased at age 62        Cause of Death: Prostate cancer  . Sister  Alive  . Brother  Alive  . Brother  Alive        His family history includes Hypertension in his mother; Prostate cancer (age of onset: 4355) in his father.      No Known Allergies   Current Outpatient Medications:  .  aspirin 81 MG tablet, Take 81 mg by mouth daily., Disp: , Rfl:  .  levothyroxine (SYNTHROID, LEVOTHROID) 100 MCG tablet, TAKE 1 TABLET (100 MCG TOTAL) DAILY BY MOUTH., Disp: 90 tablet, Rfl: 1   Patient Care Team: Malva LimesFisher,  E, MD as PCP - General (Family Medicine) Lonna CobbStoioff, Verna CzechScott C, MD (Urology)    Objective:    Vitals: BP (!) 148/79 (BP Location: Left Arm, Patient Position: Sitting, Cuff Size: Large)   Pulse (!) 58   Temp (!) 96.8 F (36 C) (Temporal)   Ht 5\' 8"  (1.727 m)   Wt 208 lb (94.3 kg)   BMI 31.63 kg/m    Vitals:   07/11/19 1528  BP: (!) 148/79  Pulse: (!) 58  Temp: (!) 96.8 F (36 C)  TempSrc: Temporal  Weight: 208 lb (94.3 kg)  Height: 5\' 8"  (1.727 m)     Physical Exam   General Appearance:    Alert, cooperative, no distress, appears stated age  Head:    Normocephalic, without obvious abnormality, atraumatic  Eyes:    PERRL, conjunctiva/corneas clear, EOM's intact, fundi    benign, both eyes       Ears:    Normal TM's and external ear canals, both ears  Nose:   Nares normal, septum midline, mucosa normal, no drainage   or sinus tenderness  Throat:   Lips, mucosa, and tongue normal; teeth and gums normal  Neck:   Supple, symmetrical, trachea midline, no adenopathy;       thyroid:  No enlargement/tenderness/nodules; no carotid   bruit or JVD  Back:     Symmetric, no curvature, ROM normal, no CVA tenderness  Lungs:     Clear to auscultation bilaterally, respirations unlabored  Chest wall:    No tenderness or deformity  Heart:    Bradycardic. Normal rhythm. No murmurs, rubs, or gallops.  S1 and S2 normal  Abdomen:     Soft, non-tender, bowel sounds active all four quadrants,    no masses, no  organomegaly  Genitalia:    deferred  Rectal:    deferred  Extremities:   All extremities are intact. No cyanosis or edema  Pulses:   2+ and symmetric all extremities  Skin:   Skin color, texture, turgor normal, no rashes or lesions  Lymph nodes:   Cervical, supraclavicular, and axillary nodes normal  Neurologic:   CNII-XII intact. Normal strength, sensation and reflexes  throughout    Depression Screen PHQ 2/9 Scores 07/11/2019 05/27/2017 03/31/2016  PHQ - 2 Score 2 0 0  PHQ- 9 Score 5 - 0       Assessment & Plan:     Routine Health Maintenance and Physical Exam  Exercise Activities and Dietary recommendations Goals   None     Immunization History  Administered Date(s) Administered  . Tdap 01/05/2008    Health Maintenance  Topic Date Due  . HIV Screening  10/12/1972  . TETANUS/TDAP  01/04/2018  . COLONOSCOPY  07/07/2018  . INFLUENZA VACCINE  06/18/2019  . Hepatitis C Screening  Completed     Discussed health benefits of physical activity, and encouraged him to engage in regular exercise appropriate for his age and condition.    --------------------------------------------------------------------  1. Other fatigue  - CBC  2. Annual physical exam He refused flu vaccine and shingles vaccine today.  Counseled regarding prudent diet and regular exercise.   - Comprehensive metabolic panel - Lipid panel - PSA - CBC  3. Prostate cancer screening  - PSA - TSH  4. Need for prophylactic vaccination using tetanus and diphtheria toxoids adsorbed (Td) vaccine  - Tdap vaccine greater than or equal to 7yo IM  5. Hypothyroidism, unspecified type   6. Hyperglycemia  - Hemoglobin A1c  7. History of colon polyps  - Ambulatory referral to Gastroenterology   Mila Merryonald , MD  [facility]

## 2019-07-13 ENCOUNTER — Other Ambulatory Visit: Payer: Self-pay | Admitting: Family Medicine

## 2019-07-13 LAB — CBC
Hematocrit: 48.1 % (ref 37.5–51.0)
Hemoglobin: 16.5 g/dL (ref 13.0–17.7)
MCH: 30.1 pg (ref 26.6–33.0)
MCHC: 34.3 g/dL (ref 31.5–35.7)
MCV: 88 fL (ref 79–97)
Platelets: 255 10*3/uL (ref 150–450)
RBC: 5.48 x10E6/uL (ref 4.14–5.80)
RDW: 12 % (ref 11.6–15.4)
WBC: 5.6 10*3/uL (ref 3.4–10.8)

## 2019-07-13 LAB — LIPID PANEL
Chol/HDL Ratio: 3.9 ratio (ref 0.0–5.0)
Cholesterol, Total: 227 mg/dL — ABNORMAL HIGH (ref 100–199)
HDL: 58 mg/dL (ref >39)
LDL Calculated: 134 mg/dL — ABNORMAL HIGH (ref 0–99)
Triglycerides: 174 mg/dL — ABNORMAL HIGH (ref 0–149)
VLDL Cholesterol Cal: 35 mg/dL (ref 5–40)

## 2019-07-13 LAB — COMPREHENSIVE METABOLIC PANEL
ALT: 36 IU/L (ref 0–44)
AST: 25 IU/L (ref 0–40)
Albumin/Globulin Ratio: 1.8 (ref 1.2–2.2)
Albumin: 4.4 g/dL (ref 3.8–4.8)
Alkaline Phosphatase: 79 IU/L (ref 39–117)
BUN/Creatinine Ratio: 22 (ref 10–24)
BUN: 19 mg/dL (ref 8–27)
Bilirubin Total: 0.4 mg/dL (ref 0.0–1.2)
CO2: 28 mmol/L (ref 20–29)
Calcium: 9.1 mg/dL (ref 8.6–10.2)
Chloride: 100 mmol/L (ref 96–106)
Creatinine, Ser: 0.88 mg/dL (ref 0.76–1.27)
GFR calc Af Amer: 107 mL/min/{1.73_m2} (ref >59)
GFR calc non Af Amer: 93 mL/min/{1.73_m2} (ref >59)
Globulin, Total: 2.5 g/dL (ref 1.5–4.5)
Glucose: 116 mg/dL — ABNORMAL HIGH (ref 65–99)
Potassium: 5 mmol/L (ref 3.5–5.2)
Sodium: 142 mmol/L (ref 134–144)
Total Protein: 6.9 g/dL (ref 6.0–8.5)

## 2019-07-13 LAB — TSH: TSH: 5.64 u[IU]/mL — ABNORMAL HIGH (ref 0.450–4.500)

## 2019-07-13 LAB — PSA: Prostate Specific Ag, Serum: 3 ng/mL (ref 0.0–4.0)

## 2019-07-13 LAB — HEMOGLOBIN A1C
Est. average glucose Bld gHb Est-mCnc: 105 mg/dL
Hgb A1c MFr Bld: 5.3 % (ref 4.8–5.6)

## 2019-07-13 MED ORDER — LEVOTHYROXINE SODIUM 125 MCG PO TABS: 125.0000 ug | ORAL_TABLET | Freq: Every day | ORAL | 0 refills | Status: DC

## 2019-08-20 ENCOUNTER — Other Ambulatory Visit: Payer: Self-pay | Admitting: Family Medicine

## 2019-08-21 MED ORDER — LEVOTHYROXINE SODIUM 125 MCG PO TABS
125.0000 ug | ORAL_TABLET | Freq: Every day | ORAL | 3 refills | Status: DC
Start: 2019-08-21 — End: 2020-11-14

## 2019-09-15 ENCOUNTER — Telehealth: Payer: Self-pay | Admitting: Family Medicine

## 2019-09-15 DIAGNOSIS — R972 Elevated prostate specific antigen [PSA]: Secondary | ICD-10-CM

## 2019-09-15 DIAGNOSIS — E039 Hypothyroidism, unspecified: Secondary | ICD-10-CM

## 2019-09-15 NOTE — Telephone Encounter (Signed)
Please advise patient is due to recheck thyroid function (TSH) and PSA since it was elevated when checked in August. Please print and leave order for him at lab, does not need to fast.

## 2019-09-15 NOTE — Telephone Encounter (Signed)
LMTCB  **Lab requisition placed up front.

## 2019-09-16 NOTE — Telephone Encounter (Signed)
Patient advised.

## 2019-09-20 LAB — PSA TOTAL (REFLEX TO FREE): Prostate Specific Ag, Serum: 0.9 ng/mL (ref 0.0–4.0)

## 2019-09-20 LAB — TSH: TSH: 3.06 u[IU]/mL (ref 0.450–4.500)

## 2019-11-09 ENCOUNTER — Other Ambulatory Visit: Payer: Self-pay

## 2019-11-09 ENCOUNTER — Ambulatory Visit (INDEPENDENT_AMBULATORY_CARE_PROVIDER_SITE_OTHER): Payer: No Typology Code available for payment source | Admitting: Physician Assistant

## 2019-11-09 ENCOUNTER — Encounter: Payer: Self-pay | Admitting: Physician Assistant

## 2019-11-09 VITALS — BP 182/96 | HR 62 | Temp 96.9°F | Wt 216.0 lb

## 2019-11-09 DIAGNOSIS — F32A Depression, unspecified: Secondary | ICD-10-CM

## 2019-11-09 DIAGNOSIS — F329 Major depressive disorder, single episode, unspecified: Secondary | ICD-10-CM

## 2019-11-09 DIAGNOSIS — R030 Elevated blood-pressure reading, without diagnosis of hypertension: Secondary | ICD-10-CM | POA: Diagnosis not present

## 2019-11-09 MED ORDER — SERTRALINE HCL 50 MG PO TABS: 50.0000 mg | ORAL_TABLET | Freq: Every day | ORAL | 0 refills | Status: DC

## 2019-11-09 NOTE — Progress Notes (Signed)
Patient: Reginald Simpson Male    DOB: 1956-10-09   63 y.o.   MRN: [id number] Visit Date: 11/09/2019  Today's Provider: Trey Sailors, PA-C   Chief Complaint  Patient presents with  . Depression   Subjective:     Depression        This is a new problem.  The current episode started 1 to 4 weeks ago (Has been going on for several months. ).   The problem has been gradually worsening since onset.  Associated symptoms include decreased concentration, fatigue, helplessness, hopelessness, decreased interest and sad.  Associated symptoms include does not have insomnia, no restlessness, no appetite change, no headaches and no suicidal ideas.  Patient reports he had episodes of depression prior and had bad flare 30 years ago. He was taking medications in early 90's. Reports taking zoloft which did work for him. Denies SI/HI.   Elevated BP: Historically borderline but today is quite elevated. Patient denies use of decongestants or NSAIDs. Denies headache, SOB. He does have a BP cuff at home.   BP Readings from Last 8 Encounters:  11/09/19 (!) 192/103  07/11/19 (!) 148/79  04/14/18 140/80  12/01/17 128/78  08/28/17 (!) 142/90  08/06/17 (!) 150/89  05/27/17 128/82  03/31/16 120/78   Wt Readings from Last 3 Encounters:  11/09/19 216 lb (98 kg)  07/11/19 208 lb (94.3 kg)  04/14/18 194 lb (88 kg)     No Known Allergies   Current Outpatient Medications:  .  aspirin 81 MG tablet, Take 81 mg by mouth daily., Disp: , Rfl:  .  levothyroxine (SYNTHROID) 125 MCG tablet, Take 1 tablet (125 mcg total) by mouth daily., Disp: 90 tablet, Rfl: 3  Review of Systems  Constitutional: Positive for fatigue. Negative for appetite change, chills and fever.  Respiratory: Negative.  Negative for chest tightness, shortness of breath and wheezing.   Cardiovascular: Negative.  Negative for chest pain and palpitations.  Gastrointestinal: Negative.  Negative for abdominal pain, nausea and vomiting.    Neurological: Positive for light-headedness (Only one day about two weeks ago. ). Negative for dizziness and headaches.  Psychiatric/Behavioral: Positive for decreased concentration, depression and dysphoric mood. Negative for behavioral problems, hallucinations, self-injury, sleep disturbance and suicidal ideas. The patient is nervous/anxious. The patient does not have insomnia.     Social History   Tobacco Use  . Smoking status: Never Smoker  . Smokeless tobacco: Never Used  Substance Use Topics  . Alcohol use: Yes    Alcohol/week: 0.0 standard drinks    Comment: 2-3 beers or glasses of wine 3 night each week      Objective:   BP (!) 192/103 (BP Location: Left Arm, Patient Position: Sitting, Cuff Size: Large)   Pulse 62   Temp (!) 96.9 F (36.1 C) (Temporal)   Wt 216 lb (98 kg)   BMI 32.84 kg/m  Vitals:   11/09/19 1537 11/09/19 1543  BP: (!) 173/103 (!) 192/103  Pulse: (!) 57 62  Temp: (!) 96.9 F (36.1 C)   TempSrc: Temporal   Weight: 216 lb (98 kg)   Body mass index is 32.84 kg/m.   Physical Exam Constitutional:      Appearance: Normal appearance.  Cardiovascular:     Rate and Rhythm: Normal rate and regular rhythm.     Pulses: Normal pulses.     Heart sounds: Normal heart sounds.  Skin:    General: Skin is warm and dry.  Neurological:  Mental Status: He is alert and oriented to person, place, and time. Mental status is at baseline.  Psychiatric:        Mood and Affect: Mood normal.        Behavior: Behavior normal.      No results found for any visits on 11/09/19.     Assessment & Plan    1. Depression, unspecified depression type  Start zoloft as it worked previously. Counseled on potential side effects. Follow up 6 weeks. Declines counseling at this point.  - sertraline (ZOLOFT) 50 MG tablet; Take 1 tablet (50 mg total) by mouth daily.  Dispense: 90 tablet; Refill: 0  2. Elevated BP without diagnosis of hypertension  Lower on recheck.  Advised patient to check blood pressure 1-2 times per week and this can be rechecked at follow up.   The entirety of the information documented in the History of Present Illness, Review of Systems and Physical Exam were personally obtained by me. Portions of this information were initially documented by Ashley Royalty, CMA and reviewed by me for thoroughness and accuracy.        Trinna Post, PA-C  [facility]

## 2019-12-21 ENCOUNTER — Ambulatory Visit (INDEPENDENT_AMBULATORY_CARE_PROVIDER_SITE_OTHER): Payer: No Typology Code available for payment source | Admitting: Family Medicine

## 2019-12-21 ENCOUNTER — Other Ambulatory Visit: Payer: Self-pay

## 2019-12-21 ENCOUNTER — Encounter: Payer: Self-pay | Admitting: Family Medicine

## 2019-12-21 VITALS — BP 150/82 | HR 68 | Temp 97.1°F | Resp 16 | Wt 213.0 lb

## 2019-12-21 DIAGNOSIS — F3341 Major depressive disorder, recurrent, in partial remission: Secondary | ICD-10-CM | POA: Diagnosis not present

## 2019-12-21 DIAGNOSIS — I10 Essential (primary) hypertension: Secondary | ICD-10-CM | POA: Insufficient documentation

## 2019-12-21 DIAGNOSIS — F339 Major depressive disorder, recurrent, unspecified: Secondary | ICD-10-CM | POA: Insufficient documentation

## 2019-12-21 DIAGNOSIS — R030 Elevated blood-pressure reading, without diagnosis of hypertension: Secondary | ICD-10-CM | POA: Diagnosis not present

## 2019-12-21 NOTE — Progress Notes (Signed)
Patient: Reginald Simpson Male    DOB: 31-Aug-1956   64 y.o.   MRN: [id number] Visit Date: 12/21/2019  Today's Provider: Lelon Huh, MD   Chief Complaint  Patient presents with  . Hypertension  . Depression   Subjective:     HPI  Hypertension, follow-up:  BP Readings from Last 3 Encounters:  12/21/19 (!) 150/82  11/09/19 (!) 182/96  07/11/19 (!) 148/79    He was last seen for hypertension 6 weeks ago.  BP at that visit was 182/96. Management since that visit includes no changes; patient was advised to check blood pressure 1-2 times per week and this can be rechecked at follow up . He reports good compliance with treatment. He is not having side effects.  He is exercising. He is adherent to low salt diet.   Outside blood pressures are not checked. He is experiencing none.  Patient denies chest pain, chest pressure/discomfort, claudication, dyspnea, exertional chest pressure/discomfort, fatigue, irregular heart beat, lower extremity edema, near-syncope, orthopnea, palpitations, paroxysmal nocturnal dyspnea, syncope and tachypnea.   Cardiovascular risk factors include advanced age (older than 39 for men, 63 for women), hypertension and male gender.  Use of agents associated with hypertension: NSAIDS.      Weight trend: fluctuating a bit Wt Readings from Last 3 Encounters:  12/21/19 213 lb (96.6 kg)  11/09/19 216 lb (98 kg)  07/11/19 208 lb (94.3 kg)    Current diet: well balanced  ------------------------------------------------------------------------  Follow up for Depression:  The patient was last seen for this 6 weeks ago (seen by Carles Collet, PA-C). Changes made at last visit include starting Zoloft.  He reports good compliance with treatment. He feels that condition is Improved. He is not having side effects.   ------------------------------------------------------------------------------------  No Known Allergies   Current Outpatient  Medications:  .  aspirin 81 MG tablet, Take 81 mg by mouth daily., Disp: , Rfl:  .  levothyroxine (SYNTHROID) 125 MCG tablet, Take 1 tablet (125 mcg total) by mouth daily., Disp: 90 tablet, Rfl: 3 .  sertraline (ZOLOFT) 50 MG tablet, Take 1 tablet (50 mg total) by mouth daily., Disp: 90 tablet, Rfl: 0  Review of Systems  Constitutional: Negative for appetite change, chills and fever.  Respiratory: Negative for chest tightness, shortness of breath and wheezing.   Cardiovascular: Negative for chest pain and palpitations.  Gastrointestinal: Negative for abdominal pain, nausea and vomiting.    Social History   Tobacco Use  . Smoking status: Never Smoker  . Smokeless tobacco: Never Used  Substance Use Topics  . Alcohol use: Yes    Alcohol/week: 0.0 standard drinks    Comment: 2-3 beers or glasses of wine 3 night each week      Objective:   BP (!) 150/82 (BP Location: Right Arm, Cuff Size: Large)   Pulse 68   Temp (!) 97.1 F (36.2 C) (Temporal)   Resp 16   Wt 213 lb (96.6 kg)   BMI 32.39 kg/m  Vitals:   12/21/19 0820 12/21/19 0827  BP: (!) 142/80 (!) 150/82  Pulse: 68   Resp: 16   Temp: (!) 97.1 F (36.2 C)   TempSrc: Temporal   Weight: 213 lb (96.6 kg)   Body mass index is 32.39 kg/m.   Physical Exam   General: Appearance:    Overweight male in no acute distress  Eyes:    PERRL, conjunctiva/corneas clear, EOM's intact       Lungs:  Clear to auscultation bilaterally, respirations unlabored  Heart:    Normal heart rate. Normal rhythm. No murmurs, rubs, or gallops.   MS:   All extremities are intact.   Neurologic:   Awake, alert, oriented x 3. No apparent focal neurological           defect.             Assessment & Plan    1. Recurrent major depressive disorder, in partial remission (HCC) Doing much better since starting back on sertraline which he had previously taken in his 30s. Anticipate staying on sertraline 6-12 months. Will rf medication in March and  follow up here in about 4 months.   2. Elevated BP without diagnosis of hypertension He has recently started back on healthier diet and exercising to work on losing weight. Will reassess at follow up in about 4 months to determine if medications are needed.       [name], MD  [facility]

## 2019-12-21 NOTE — Patient Instructions (Signed)
DASH Eating Plan DASH stands for "Dietary Approaches to Stop Hypertension." The DASH eating plan is a healthy eating plan that has been shown to reduce high blood pressure (hypertension). It may also reduce your risk for type 2 diabetes, heart disease, and stroke. The DASH eating plan may also help with weight loss. What are tips for following this plan?  General guidelines  Avoid eating more than 2,300 mg (milligrams) of salt (sodium) a day. If you have hypertension, you may need to reduce your sodium intake to 1,500 mg a day.  Limit alcohol intake to no more than 1 drink a day for nonpregnant women and 2 drinks a day for men. One drink equals 12 oz of beer, 5 oz of wine, or 1 oz of hard liquor.  Work with your health care provider to maintain a healthy body weight or to lose weight. Ask what an ideal weight is for you.  Get at least 30 minutes of exercise that causes your heart to beat faster (aerobic exercise) most days of the week. Activities may include walking, swimming, or biking.  Work with your health care provider or diet and nutrition specialist (dietitian) to adjust your eating plan to your individual calorie needs. Reading food labels   Check food labels for the amount of sodium per serving. Choose foods with less than 5 percent of the Daily Value of sodium. Generally, foods with less than 300 mg of sodium per serving fit into this eating plan.  To find whole grains, look for the word "whole" as the first word in the ingredient list. Shopping  Buy products labeled as "low-sodium" or "no salt added."  Buy fresh foods. Avoid canned foods and premade or frozen meals. Cooking  Avoid adding salt when cooking. Use salt-free seasonings or herbs instead of table salt or sea salt. Check with your health care provider or pharmacist before using salt substitutes.  Do not fry foods. Cook foods using healthy methods such as baking, boiling, grilling, and broiling instead.  Cook with  heart-healthy oils, such as olive, canola, soybean, or sunflower oil. Meal planning  Eat a balanced diet that includes: ? 5 or more servings of fruits and vegetables each day. At each meal, try to fill half of your plate with fruits and vegetables. ? Up to 6-8 servings of whole grains each day. ? Less than 6 oz of lean meat, poultry, or fish each day. A 3-oz serving of meat is about the same size as a deck of cards. One egg equals 1 oz. ? 2 servings of low-fat dairy each day. ? A serving of nuts, seeds, or beans 5 times each week. ? Heart-healthy fats. Healthy fats called Omega-3 fatty acids are found in foods such as flaxseeds and coldwater fish, like sardines, salmon, and mackerel.  Limit how much you eat of the following: ? Canned or prepackaged foods. ? Food that is high in trans fat, such as fried foods. ? Food that is high in saturated fat, such as fatty meat. ? Sweets, desserts, sugary drinks, and other foods with added sugar. ? Full-fat dairy products.  Do not salt foods before eating.  Try to eat at least 2 vegetarian meals each week.  Eat more home-cooked food and less restaurant, buffet, and fast food.  When eating at a restaurant, ask that your food be prepared with less salt or no salt, if possible. What foods are recommended? The items listed may not be a complete list. Talk with your dietitian about   what dietary choices are best for you. Grains Whole-grain or whole-wheat bread. Whole-grain or whole-wheat pasta. Brown rice. Oatmeal. Quinoa. Bulgur. Whole-grain and low-sodium cereals. Pita bread. Low-fat, low-sodium crackers. Whole-wheat flour tortillas. Vegetables Fresh or frozen vegetables (raw, steamed, roasted, or grilled). Low-sodium or reduced-sodium tomato and vegetable juice. Low-sodium or reduced-sodium tomato sauce and tomato paste. Low-sodium or reduced-sodium canned vegetables. Fruits All fresh, dried, or frozen fruit. Canned fruit in natural juice (without  added sugar). Meat and other protein foods Skinless chicken or turkey. Ground chicken or turkey. Pork with fat trimmed off. Fish and seafood. Egg whites. Dried beans, peas, or lentils. Unsalted nuts, nut butters, and seeds. Unsalted canned beans. Lean cuts of beef with fat trimmed off. Low-sodium, lean deli meat. Dairy Low-fat (1%) or fat-free (skim) milk. Fat-free, low-fat, or reduced-fat cheeses. Nonfat, low-sodium ricotta or cottage cheese. Low-fat or nonfat yogurt. Low-fat, low-sodium cheese. Fats and oils Soft margarine without trans fats. Vegetable oil. Low-fat, reduced-fat, or light mayonnaise and salad dressings (reduced-sodium). Canola, safflower, olive, soybean, and sunflower oils. Avocado. Seasoning and other foods Herbs. Spices. Seasoning mixes without salt. Unsalted popcorn and pretzels. Fat-free sweets. What foods are not recommended? The items listed may not be a complete list. Talk with your dietitian about what dietary choices are best for you. Grains Baked goods made with fat, such as croissants, muffins, or some breads. Dry pasta or rice meal packs. Vegetables Creamed or fried vegetables. Vegetables in a cheese sauce. Regular canned vegetables (not low-sodium or reduced-sodium). Regular canned tomato sauce and paste (not low-sodium or reduced-sodium). Regular tomato and vegetable juice (not low-sodium or reduced-sodium). Pickles. Olives. Fruits Canned fruit in a light or heavy syrup. Fried fruit. Fruit in cream or butter sauce. Meat and other protein foods Fatty cuts of meat. Ribs. Fried meat. Bacon. Sausage. Bologna and other processed lunch meats. Salami. Fatback. Hotdogs. Bratwurst. Salted nuts and seeds. Canned beans with added salt. Canned or smoked fish. Whole eggs or egg yolks. Chicken or turkey with skin. Dairy Whole or 2% milk, cream, and half-and-half. Whole or full-fat cream cheese. Whole-fat or sweetened yogurt. Full-fat cheese. Nondairy creamers. Whipped toppings.  Processed cheese and cheese spreads. Fats and oils Butter. Stick margarine. Lard. Shortening. Ghee. Bacon fat. Tropical oils, such as coconut, palm kernel, or palm oil. Seasoning and other foods Salted popcorn and pretzels. Onion salt, garlic salt, seasoned salt, table salt, and sea salt. Worcestershire sauce. Tartar sauce. Barbecue sauce. Teriyaki sauce. Soy sauce, including reduced-sodium. Steak sauce. Canned and packaged gravies. Fish sauce. Oyster sauce. Cocktail sauce. Horseradish that you find on the shelf. Ketchup. Mustard. Meat flavorings and tenderizers. Bouillon cubes. Hot sauce and Tabasco sauce. Premade or packaged marinades. Premade or packaged taco seasonings. Relishes. Regular salad dressings. Where to find more information:  National Heart, Lung, and Blood Institute: www.nhlbi.nih.gov  American Heart Association: www.heart.org Summary  The DASH eating plan is a healthy eating plan that has been shown to reduce high blood pressure (hypertension). It may also reduce your risk for type 2 diabetes, heart disease, and stroke.  With the DASH eating plan, you should limit salt (sodium) intake to 2,300 mg a day. If you have hypertension, you may need to reduce your sodium intake to 1,500 mg a day.  When on the DASH eating plan, aim to eat more fresh fruits and vegetables, whole grains, lean proteins, low-fat dairy, and heart-healthy fats.  Work with your health care provider or diet and nutrition specialist (dietitian) to adjust your eating plan to your   individual calorie needs. This information is not intended to replace advice given to you by your health care provider. Make sure you discuss any questions you have with your health care provider. Document Revised: 10/16/2017 Document Reviewed: 10/27/2016 Elsevier Patient Education  2020 [employer]  

## 2020-01-11 ENCOUNTER — Telehealth: Payer: Self-pay | Admitting: Family Medicine

## 2020-01-11 NOTE — Telephone Encounter (Signed)
Patient advised.

## 2020-01-11 NOTE — Telephone Encounter (Signed)
Please advise patient we received letter from Dr. Hester Mates office with  [facility] that they have been trying to contact him to schedule colonoscopy. He will need to call them to get it scheduled.

## 2020-01-27 ENCOUNTER — Other Ambulatory Visit: Payer: Self-pay | Admitting: Family Medicine

## 2020-01-27 DIAGNOSIS — F329 Major depressive disorder, single episode, unspecified: Secondary | ICD-10-CM

## 2020-01-27 DIAGNOSIS — F32A Depression, unspecified: Secondary | ICD-10-CM

## 2020-01-27 MED ORDER — SERTRALINE HCL 50 MG PO TABS: 50.0000 mg | ORAL_TABLET | Freq: Every day | ORAL | 4 refills | Status: DC

## 2020-01-27 NOTE — Telephone Encounter (Signed)
Patient's wife Reginald Simpson advised of ICD-10 code for history of colon polyps.

## 2020-01-27 NOTE — Telephone Encounter (Signed)
Patient's wife is calling to ask what CPT code is for the colonoscopy that the patient is needing. Patient was advised to check with PCP for the code. Please advise CB- [phone]

## 2020-02-07 ENCOUNTER — Encounter: Payer: Self-pay | Admitting: Family Medicine

## 2020-02-07 DIAGNOSIS — F32A Depression, unspecified: Secondary | ICD-10-CM

## 2020-02-07 DIAGNOSIS — F329 Major depressive disorder, single episode, unspecified: Secondary | ICD-10-CM

## 2020-02-08 ENCOUNTER — Encounter: Payer: Self-pay | Admitting: Family Medicine

## 2020-02-08 MED ORDER — SERTRALINE HCL 50 MG PO TABS: 75.0000 mg | ORAL_TABLET | Freq: Every day | ORAL | 1 refills | Status: DC

## 2020-02-09 ENCOUNTER — Other Ambulatory Visit: Payer: Self-pay | Admitting: Family Medicine

## 2020-02-09 DIAGNOSIS — Z1211 Encounter for screening for malignant neoplasm of colon: Secondary | ICD-10-CM

## 2020-02-27 LAB — COLOGUARD: Cologuard: NEGATIVE

## 2020-02-28 LAB — COLOGUARD: COLOGUARD: NEGATIVE

## 2020-04-20 ENCOUNTER — Encounter: Payer: Self-pay | Admitting: Family Medicine

## 2020-04-20 ENCOUNTER — Other Ambulatory Visit: Payer: Self-pay

## 2020-04-20 ENCOUNTER — Ambulatory Visit (INDEPENDENT_AMBULATORY_CARE_PROVIDER_SITE_OTHER): Payer: Self-pay | Admitting: Family Medicine

## 2020-04-20 DIAGNOSIS — F329 Major depressive disorder, single episode, unspecified: Secondary | ICD-10-CM

## 2020-04-20 DIAGNOSIS — F32A Depression, unspecified: Secondary | ICD-10-CM

## 2020-04-20 MED ORDER — ESCITALOPRAM OXALATE 10 MG PO TABS: 10.0000 mg | ORAL_TABLET | Freq: Every day | ORAL | 1 refills | Status: DC

## 2020-04-20 NOTE — Progress Notes (Signed)
I,Roshena L Chambers,acting as a scribe for [name], MD.,have documented all relevant documentation on the behalf of [name], MD,as directed by  [name], MD while in the presence of [name], MD.   Established patient visit   Patient: Reginald Simpson   DOB: 1956/01/29   64 y.o. Male  MRN: [id number] Visit Date: 05/30/2020  Today's healthcare provider: [name], MD   Chief Complaint  Patient presents with  . Depression   Subjective    HPI Depression, Follow-up  He  was last seen for this June 4th Changes made at last visit include changing from sertraline to escitalopram since he had only improved somewhat with 50mg  sertraline, and did not tolerate higher dosage of sertraline.   He reports good compliance with treatment. He has titrated up to taking 2 tablets daily of the Escitalopram. He is not having side effects.   He reports good tolerance of treatment. He feels he is Improved since last visit. He felt his mood improved significantly after change to from sertraline to 10mg  escitalopram, but didn't notice much change as he titrated up on dose. He has had some nausea, feels a little fatigued, but feels he is generally tolerating medication well.   Depression screen [name] 2/9 05/30/2020 04/20/2020 11/09/2019  Decreased Interest 1 3 2   Down, Depressed, Hopeless 1 3 2   PHQ - 2 Score 2 6 4   Altered sleeping 1 3 2   Tired, decreased energy 2 2 1   Change in appetite 0 0 1  Feeling bad or failure about yourself  1 3 1   Trouble concentrating 1 3 2   Moving slowly or fidgety/restless 0 0 0  Suicidal thoughts 0 1 0  PHQ-9 Score 7 18 11   Difficult doing work/chores Not difficult at all Not difficult at all Not difficult at all    -----------------------------------------------------------------------------------------     Medications: Outpatient Medications Prior to Visit  Medication Sig  . aspirin 81 MG tablet Take 81 mg by mouth daily.  06/20/2020  escitalopram (LEXAPRO) 10 MG tablet Take 1 tablet (10 mg total) by mouth daily. Start 1 tablet daily for one week, then increase to 1 1/2 tablets daily then increase to 2 tablets daily  . levothyroxine (SYNTHROID) 125 MCG tablet Take 1 tablet (125 mcg total) by mouth daily.   No facility-administered medications prior to visit.    Review of Systems  Constitutional: Negative for appetite change, chills and fever.  Respiratory: Negative for chest tightness, shortness of breath and wheezing.   Cardiovascular: Negative for chest pain and palpitations.  Gastrointestinal: Negative for abdominal pain, nausea and vomiting.     Objective    BP (!) 158/97 (BP Location: Right Arm, Cuff Size: Large)   Pulse (!) 51   Temp (!) 96.9 F (36.1 C) (Temporal)   Resp 16   Wt 214 lb (97.1 kg)   BMI 32.54 kg/m    Physical Exam  General appearance: Obese male, cooperative and in no acute distress Head: Normocephalic, without obvious abnormality, atraumatic Respiratory: Respirations even and unlabored, normal respiratory rate Extremities: All extremities are intact.  Skin: Skin color, texture, turgor normal. No rashes seen  Psych: Appropriate mood and affect. Neurologic: Mental status: Alert, oriented to person, place, and time, thought content appropriate.   No results found for any visits on 05/30/20.  Assessment & Plan     1. Recurrent major depressive disorder, in full remission (HCC)   2. Situational stress  Doing better with change from sertraline  to escitalopram. Currently has full bottle of 10mg  tablets and taking 2 daily. He didn't notice too much different titrating up from 10mg  to 20mg . Advised if he has persistent side effects he could reduce to 15 or 10mg  a day as needed. Otherwise is to follow up in 3-4 months.     No follow-ups on file.      The entirety of the information documented in the History of Present Illness, Review of Systems and Physical Exam were personally  obtained by me. Portions of this information were initially documented by the CMA and reviewed by me for thoroughness and accuracy.      Lelon Huh, MD  [facility] [phone] (phone) [phone] (fax)  [place]

## 2020-04-20 NOTE — Patient Instructions (Signed)
.   Please review the attached list of medications and notify my office if there are any errors.   . Please bring all of your medications to every appointment so we can make sure that our medication list is the same as yours.   

## 2020-04-20 NOTE — Progress Notes (Signed)
I,Reginald Simpson,acting as a scribe for [name], MD.,have documented all relevant documentation on the behalf of [name], MD,as directed by  [name], MD while in the presence of [name], MD.  Established patient visit   Patient: Reginald Simpson   DOB: 11/17/1956   64 y.o. Male  MRN: [id number] Visit Date: 04/20/2020  Today's healthcare provider: [name], MD   Chief Complaint  Patient presents with  . Depression  . Blood Pressure Check   Subjective    HPI Depression, Follow-up  Changes made on 02/07/2020 include increasing Sertraline to 75mg  daily.   He reports fair compliance with treatment. Patient tried increasing Sertraline to 75mg  daily, but eventually cut back down to 50mg  daily. He felt that the higher dose wasn't helping. He states the 50mg  dose is helping, but he would like to discuss other options. He is not having side effects.     Depression screen [facility] 2/9 04/20/2020 11/09/2019 07/11/2019  Decreased Interest 3 2 1   Down, Depressed, Hopeless 3 2 1   PHQ - 2 Score 6 4 2   Altered sleeping 3 2 1   Tired, decreased energy 2 1 1   Change in appetite 0 1 0  Feeling bad or failure about yourself  3 1 0  Trouble concentrating 3 2 1   Moving slowly or fidgety/restless 0 0 0  Suicidal thoughts 1 0 0  PHQ-9 Score 18 11 5   Difficult doing work/chores Not difficult at all Not difficult at all Somewhat difficult    -----------------------------------------------------------------------------------------  Follow up for Elevated blood pressure:  The patient was last seen for this 4 months ago. Changes made at last visit include continuing a healthy diet and exercising to work on losing weight. Will reassess at follow up to determine if medications are needed.4/9  He reports good compliance with treatment. He feels that condition is Unchanged.  He is not having side effects.    -----------------------------------------------------------------------------------------      Medications: Outpatient Medications Prior to Visit  Medication Sig  . aspirin 81 MG tablet Take 81 mg by mouth daily.  06/20/2020 levothyroxine (SYNTHROID) 125 MCG tablet Take 1 tablet (125 mcg total) by mouth daily.  . sertraline (ZOLOFT) 50 MG tablet Take 1.5 tablets (75 mg total) by mouth daily. (Patient taking differently: Take 50 mg by mouth daily. )   No facility-administered medications prior to visit.    Review of Systems  Constitutional: Negative for appetite change, chills and fever.  Respiratory: Negative for chest tightness, shortness of breath and wheezing.   Cardiovascular: Negative for chest pain and palpitations.  Gastrointestinal: Negative for abdominal pain, nausea and vomiting.  Psychiatric/Behavioral: Positive for decreased concentration.      Objective    BP (!) 146/82 (BP Location: Left Arm, Cuff Size: Large)   Pulse (!) 56   Temp (!) 97.5 F (36.4 C) (Temporal)   Resp 16   Wt 211 lb (95.7 kg)   BMI 32.08 kg/m    Physical Exam  General appearance: Obese male, cooperative and in no acute distress Head: Normocephalic, without obvious abnormality, atraumatic Respiratory: Respirations even and unlabored, normal respiratory rate Extremities: All extremities are intact.  Skin: Skin color, texture, turgor normal. No rashes seen  Psych: Appropriate mood and affect. Neurologic: Mental status: Alert, oriented to person, place, and time, thought content appropriate.   No results found for any visits on 04/20/20.  Assessment & Plan     1. Depression, unspecified depression type He has improved with  50mg  sertraline, but is still struggling with depression, insomnia and racing thoughts and feels he needs to make medication adjustment. He did not tolerating 75mg  sertraline. Discussed options of changing to another SSRI versus change to SNRI versus adding second agent to  sertraline. Considering he is tolerating SSRI well, will try change to - escitalopram (LEXAPRO) 10 MG tablet; Take 1 tablet (10 mg total) by mouth daily. Start 1 tablet daily for one week, then increase to 1 1/2 tablets daily then increase to 2 tablets daily  Dispense: 60 tablet; Refill: 1 Call if any problems with filling or taking medication  Return in about 1 month (around 05/20/2020) for depression.      The entirety of the information documented in the History of Present Illness, Review of Systems and Physical Exam were personally obtained by me. Portions of this information were initially documented by the CMA and reviewed by me for thoroughness and accuracy.      Lelon Huh, MD  [facility] [phone] (phone) [phone] (fax)  [place]

## 2020-04-24 ENCOUNTER — Encounter: Payer: Self-pay | Admitting: Family Medicine

## 2020-05-12 ENCOUNTER — Encounter: Payer: Self-pay | Admitting: Family Medicine

## 2020-05-12 ENCOUNTER — Other Ambulatory Visit: Payer: Self-pay | Admitting: Family Medicine

## 2020-05-14 ENCOUNTER — Other Ambulatory Visit: Payer: Self-pay

## 2020-05-14 MED ORDER — ESCITALOPRAM OXALATE 10 MG PO TABS: 10.0000 mg | ORAL_TABLET | Freq: Every day | ORAL | 0 refills | Status: DC

## 2020-05-14 NOTE — Telephone Encounter (Signed)
90- days supply sent to [facility] per patient's request.

## 2020-05-30 ENCOUNTER — Ambulatory Visit (INDEPENDENT_AMBULATORY_CARE_PROVIDER_SITE_OTHER): Payer: No Typology Code available for payment source | Admitting: Family Medicine

## 2020-05-30 ENCOUNTER — Other Ambulatory Visit: Payer: Self-pay

## 2020-05-30 ENCOUNTER — Encounter: Payer: Self-pay | Admitting: Family Medicine

## 2020-05-30 VITALS — BP 158/97 | HR 51 | Temp 96.9°F | Resp 16 | Wt 214.0 lb

## 2020-05-30 DIAGNOSIS — F3342 Major depressive disorder, recurrent, in full remission: Secondary | ICD-10-CM | POA: Diagnosis not present

## 2020-05-30 DIAGNOSIS — F439 Reaction to severe stress, unspecified: Secondary | ICD-10-CM

## 2020-07-21 ENCOUNTER — Ambulatory Visit (HOSPITAL_COMMUNITY)
Admission: EM | Admit: 2020-07-21 | Discharge: 2020-07-21 | Disposition: A | Discharge status: Home / Self Care | Payer: No Typology Code available for payment source | Attending: Family Medicine | Admitting: Family Medicine

## 2020-07-21 ENCOUNTER — Other Ambulatory Visit: Payer: Self-pay

## 2020-07-21 DIAGNOSIS — R112 Nausea with vomiting, unspecified: Secondary | ICD-10-CM | POA: Diagnosis not present

## 2020-07-21 DIAGNOSIS — K529 Noninfective gastroenteritis and colitis, unspecified: Secondary | ICD-10-CM

## 2020-07-21 DIAGNOSIS — R197 Diarrhea, unspecified: Secondary | ICD-10-CM

## 2020-07-21 MED ORDER — DICYCLOMINE HCL 20 MG PO TABS
20.0000 mg | ORAL_TABLET | Freq: Four times a day (QID) | ORAL | 0 refills | Status: DC | PRN
Start: 2020-07-21 — End: 2023-01-15

## 2020-07-21 MED ORDER — METOCLOPRAMIDE HCL 10 MG PO TABS
10.0000 mg | ORAL_TABLET | Freq: Four times a day (QID) | ORAL | 0 refills | Status: DC
Start: 2020-07-21 — End: 2020-09-10

## 2020-07-21 NOTE — ED Triage Notes (Addendum)
Pt presents with c/o diarrhea, nausea and vomiting since yesterday morning. Pt states he feels he may be dehydrated from decreased oral intake. Pt also reports upper abdominal pain and cramping. Pt states he did have temp of 103 yesterday and 101.5 today. Pt reports he has been taking ImmodiumAD, Tums and Tylenol for his symptoms. Pt denies any known sick contacts. Pt denies eating any spicy or unusual foods. Pt denies any history of colon problems. Pt states he did take a home COVID antigen test today and it was negative.

## 2020-07-21 NOTE — ED Provider Notes (Signed)
[facility]   [id number] 07/21/20 Arrival Time: 1657  CC: ABDOMINAL PAIN  SUBJECTIVE:  Reginald Simpson is a 64 y.o. male who presents with complaint of abdominal discomfort that began 2 days ago.  Reports that he has been having nausea, vomiting, diarrhea.  Reports that diarrhea is the worst issue now.  Reports that he has taken Imodium with little relief.  Reports he is unable to keep food down.  Reports that he is able to keep fluids down for just a bit.  Reports that he is still voiding normally.  Denies fever, chills, appetite changes, weight changes, chest pain, SOB, constipation, hematochezia, melena, dysuria, difficulty urinating, increased frequency or urgency, flank pain, loss of bowel or bladder function, vaginal discharge, vaginal odor, vaginal bleeding, dyspareunia, pelvic pain.     No LMP for male patient.  ROS: As per HPI.  All other pertinent ROS negative.     Past Medical History:  Diagnosis Date  . Colon polyps   . Hyperglycemia    Past Surgical History:  Procedure Laterality Date  . BACK SURGERY  1994   Ruptured lumbar disk  . COLONOSCOPY  07/07/2013   Mild active colitis of Cecum, Hyperplastic polyp in rectum. [facility]  . SHOULDER ARTHROSCOPY Right 03/2017   Dr. Thomasena Edis,, [facility]  . SHOULDER ARTHROSCOPY Left 03/2019   No Known Allergies No current facility-administered medications on file prior to encounter.   Current Outpatient Medications on File Prior to Encounter  Medication Sig Dispense Refill  . aspirin 81 MG tablet Take 81 mg by mouth daily.    Marland Kitchen escitalopram (LEXAPRO) 10 MG tablet Take 1 tablet (10 mg total) by mouth daily. Start 1 tablet daily for one week, then increase to 1 1/2 tablets daily then increase to 2 tablets daily 180 tablet 0  . levothyroxine (SYNTHROID) 125 MCG tablet Take 1 tablet (125 mcg total) by mouth daily. 90 tablet 3   Social History   Socioeconomic History  . Marital status:  Married    Spouse name: Not on file  . Number of children: 3  . Years of education: Not on file  . Highest education level: Not on file  Occupational History  . Occupation: Self employed  Tobacco Use  . Smoking status: Never Smoker  . Smokeless tobacco: Never Used  Vaping Use  . Vaping Use: Never used  Substance and Sexual Activity  . Alcohol use: Yes    Alcohol/week: 0.0 standard drinks    Comment: 2-3 beers or glasses of wine 3 night each week  . Drug use: No  . Sexual activity: Not on file  Other Topics Concern  . Not on file  Social History Narrative  . Not on file   Social Determinants of Health   Financial Resource Strain:   . Difficulty of Paying Living Expenses: Not on file  Food Insecurity:   . Worried About Programme researcher, broadcasting/film/video in the Last Year: Not on file  . Ran Out of Food in the Last Year: Not on file  Transportation Needs:   . Lack of Transportation (Medical): Not on file  . Lack of Transportation (Non-Medical): Not on file  Physical Activity:   . Days of Exercise per Week: Not on file  . Minutes of Exercise per Session: Not on file  Stress:   . Feeling of Stress : Not on file  Social Connections:   . Frequency of Communication with Friends and Family: Not on file  .  Frequency of Social Gatherings with Friends and Family: Not on file  . Attends Religious Services: Not on file  . Active Member of Clubs or Organizations: Not on file  . Attends Banker Meetings: Not on file  . Marital Status: Not on file  Intimate Partner Violence:   . Fear of Current or Ex-Partner: Not on file  . Emotionally Abused: Not on file  . Physically Abused: Not on file  . Sexually Abused: Not on file   Family History  Problem Relation Age of Onset  . Hypertension Mother   . Prostate cancer Father 37     OBJECTIVE:  Vitals:   07/21/20 1939 07/21/20 1940  BP:  (!) 145/100  Pulse:  98  Resp:  19  Temp:  100.2 F (37.9 C)  TempSrc:  Oral  SpO2:  97%   Weight: 200 lb (90.7 kg)   Height: 5\' 8"  (1.727 m)     General appearance: Alert; NAD HEENT: NCAT.  Oropharynx clear.  Lungs: clear to auscultation bilaterally without adventitious breath sounds Heart: regular rate and rhythm.  Radial pulses 2+ symmetrical bilaterally Abdomen: soft, non-distended; normal active bowel sounds; generalized mild tenderness to the abdomen; nontender at McBurney's point; negative Murphy's sign; negative rebound; no guarding Back: no CVA tenderness Extremities: no edema; symmetrical with no gross deformities Skin: warm and dry Neurologic: normal gait Psychological: alert and cooperative; normal mood and affect  LABS: No results found for this or any previous visit (from the past 24 hour(s)).  DIAGNOSTIC STUDIES: No results found.   ASSESSMENT & PLAN:  1. Nausea vomiting and diarrhea   2. Noninfectious gastroenteritis, unspecified type     Meds ordered this encounter  Medications  . metoCLOPramide (REGLAN) 10 MG tablet    Sig: Take 1 tablet (10 mg total) by mouth every 6 (six) hours.    Dispense:  30 tablet    Refill:  0    Order Specific Question:   Supervising Provider    Answer:   Merrilee Jansky  . dicyclomine (BENTYL) 20 MG tablet    Sig: Take 1 tablet (20 mg total) by mouth 4 (four) times daily as needed for spasms.    Dispense:  20 tablet    Refill:  0    Order Specific Question:   Supervising Provider    Answer:   [id number] Merrilee Jansky     Reglan prescribed Dicyclomine prescribed  DIET Instructions:  30 minutes after taking nausea medicine, begin with sips of clear liquids. If able to hold down 2 - 4 ounces for 30 minutes, begin drinking more. Increase your fluid intake to replace losses. Clear liquids only for 24 hours (water, tea, sport drinks, clear flat ginger ale or cola and juices, broth, jello, popsicles, ect). Advance to bland foods, applesauce, rice, baked or boiled chicken, ect. Avoid milk, greasy foods  and anything that doesn't agree with you.  If you experience new or worsening symptoms return or go to ER such as fever, chills, nausea, vomiting, diarrhea, bloody or dark tarry stools, constipation, urinary symptoms, worsening abdominal discomfort, symptoms that do not improve with medications, inability to keep fluids down.  Reviewed expectations re: course of current medical issues. Questions answered. Outlined signs and symptoms indicating need for more acute intervention. Patient verbalized understanding. After Visit Summary given.   [id number], NP 07/22/20 1135

## 2020-07-21 NOTE — Discharge Instructions (Signed)
I have sent in reglan for you to take once daily  I have sent in dicyclomine for you to take every 6 hours as needed for abdominal cramping  If things are getting worse, go to the ER for further evaluation and treatment

## 2020-07-24 ENCOUNTER — Encounter: Payer: Self-pay | Admitting: Family Medicine

## 2020-07-28 ENCOUNTER — Emergency Department: Payer: No Typology Code available for payment source

## 2020-07-28 ENCOUNTER — Emergency Department
Admission: EM | Admit: 2020-07-28 | Discharge: 2020-07-28 | Disposition: A | Discharge status: Home / Self Care | Payer: No Typology Code available for payment source | Attending: Emergency Medicine | Admitting: Emergency Medicine

## 2020-07-28 ENCOUNTER — Encounter: Payer: Self-pay | Admitting: Emergency Medicine

## 2020-07-28 ENCOUNTER — Other Ambulatory Visit: Payer: Self-pay

## 2020-07-28 DIAGNOSIS — Z79899 Other long term (current) drug therapy: Secondary | ICD-10-CM | POA: Insufficient documentation

## 2020-07-28 DIAGNOSIS — Z79890 Hormone replacement therapy (postmenopausal): Secondary | ICD-10-CM | POA: Insufficient documentation

## 2020-07-28 DIAGNOSIS — R109 Unspecified abdominal pain: Secondary | ICD-10-CM | POA: Diagnosis present

## 2020-07-28 DIAGNOSIS — Z7982 Long term (current) use of aspirin: Secondary | ICD-10-CM | POA: Insufficient documentation

## 2020-07-28 DIAGNOSIS — E039 Hypothyroidism, unspecified: Secondary | ICD-10-CM | POA: Diagnosis not present

## 2020-07-28 DIAGNOSIS — K858 Other acute pancreatitis without necrosis or infection: Secondary | ICD-10-CM | POA: Insufficient documentation

## 2020-07-28 DIAGNOSIS — K859 Acute pancreatitis without necrosis or infection, unspecified: Secondary | ICD-10-CM

## 2020-07-28 LAB — LIPASE, BLOOD: Lipase: 95 U/L — ABNORMAL HIGH (ref 11–51)

## 2020-07-28 LAB — CBC
HCT: 45.7 % (ref 39.0–52.0)
Hemoglobin: 16.2 g/dL (ref 13.0–17.0)
MCH: 30.7 pg (ref 26.0–34.0)
MCHC: 35.4 g/dL (ref 30.0–36.0)
MCV: 86.6 fL (ref 80.0–100.0)
Platelets: 285 10*3/uL (ref 150–400)
RBC: 5.28 MIL/uL (ref 4.22–5.81)
RDW: 12.2 % (ref 11.5–15.5)
WBC: 8.5 10*3/uL (ref 4.0–10.5)
nRBC: 0 % (ref 0.0–0.2)

## 2020-07-28 LAB — HEPATITIS PANEL, ACUTE
HCV Ab: NONREACTIVE
Hep A IgM: NONREACTIVE
Hep B C IgM: NONREACTIVE
Hepatitis B Surface Ag: NONREACTIVE

## 2020-07-28 LAB — COMPREHENSIVE METABOLIC PANEL
ALT: 80 U/L — ABNORMAL HIGH (ref 0–44)
AST: 69 U/L — ABNORMAL HIGH (ref 15–41)
Albumin: 4 g/dL (ref 3.5–5.0)
Alkaline Phosphatase: 111 U/L (ref 38–126)
Anion gap: 7 (ref 5–15)
BUN: 12 mg/dL (ref 8–23)
CO2: 28 mmol/L (ref 22–32)
Calcium: 8.9 mg/dL (ref 8.9–10.3)
Chloride: 100 mmol/L (ref 98–111)
Creatinine, Ser: 0.86 mg/dL (ref 0.61–1.24)
GFR calc Af Amer: >60 mL/min (ref >60)
GFR calc non Af Amer: >60 mL/min (ref >60)
Glucose, Bld: 161 mg/dL — ABNORMAL HIGH (ref 70–99)
Potassium: 4.6 mmol/L (ref 3.5–5.1)
Sodium: 135 mmol/L (ref 135–145)
Total Bilirubin: 1.1 mg/dL (ref 0.3–1.2)
Total Protein: 7.5 g/dL (ref 6.5–8.1)

## 2020-07-28 LAB — URINALYSIS, COMPLETE (UACMP) WITH MICROSCOPIC
Bacteria, UA: NONE SEEN
Bilirubin Urine: NEGATIVE
Glucose, UA: NEGATIVE mg/dL
Hgb urine dipstick: NEGATIVE
Ketones, ur: NEGATIVE mg/dL
Leukocytes,Ua: NEGATIVE
Nitrite: NEGATIVE
Protein, ur: NEGATIVE mg/dL
Specific Gravity, Urine: 1.019 (ref 1.005–1.030)
Squamous Epithelial / HPF: NONE SEEN (ref 0–5)
pH: 9 — ABNORMAL HIGH (ref 5.0–8.0)

## 2020-07-28 IMAGING — CT CT ABD-PELV W/ CM
2 of 5 series · 15 of 46 positions shown, 17 images · IV contrast (APPLIED)
Comparison: None.

CLINICAL DATA: Abdominal pain and vomiting.

EXAM:
CT ABDOMEN AND PELVIS WITH CONTRAST
TECHNIQUE: Multidetector CT imaging of the abdomen and pelvis was performed
using the standard protocol following bolus administration of
intravenous contrast.
CONTRAST:  100mL OMNIPAQUE IOHEXOL 300 MG/ML  SOLN

[Series 2: routine abd/pel with · axial · 0.85mm/px · z∈[-244,+236]mm · 12 of 108 slices shown, 14 images]
[im 6/108  soft-tissue]
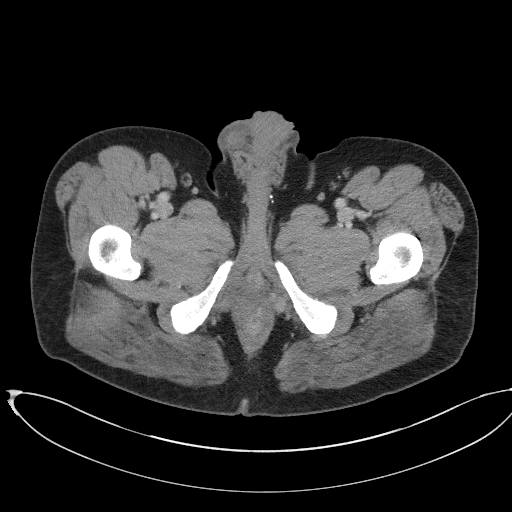
[im 6/108  bone]
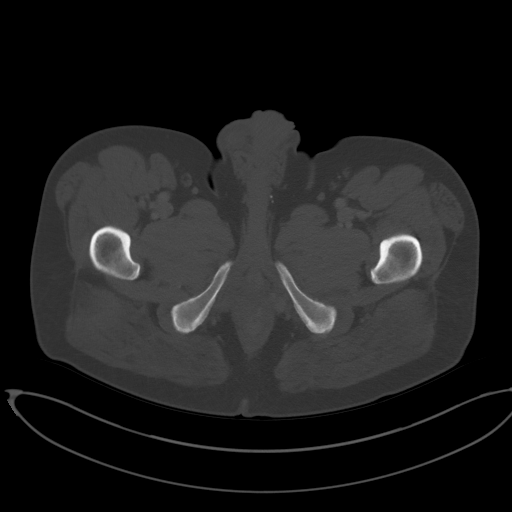
[im 17/108  soft-tissue]
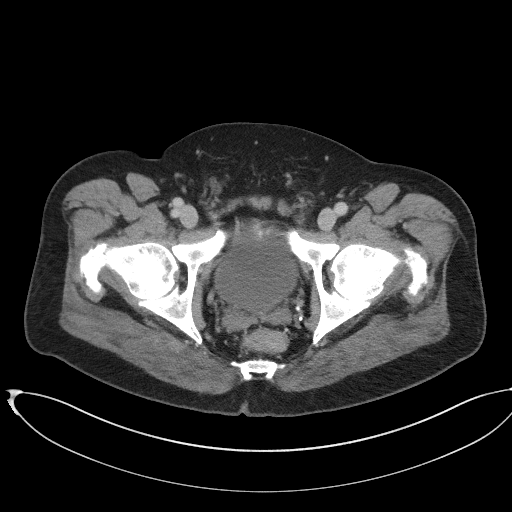
[im 23/108  soft-tissue]
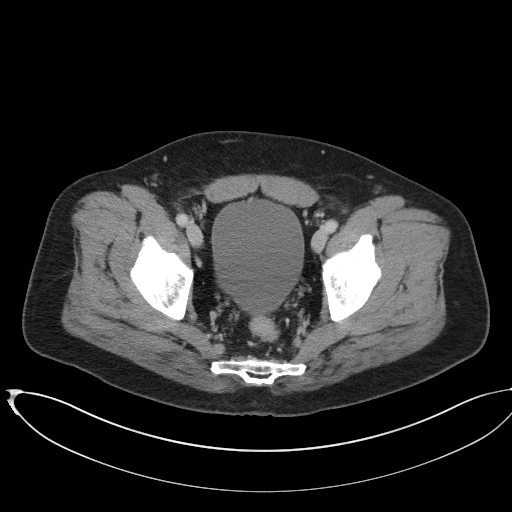
[im 34/108  soft-tissue]
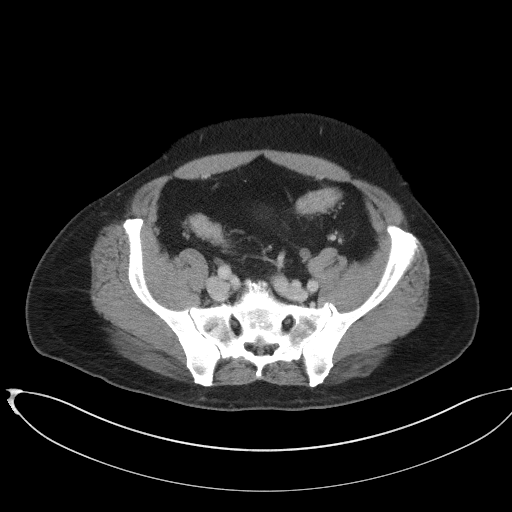
[im 40/108  soft-tissue]
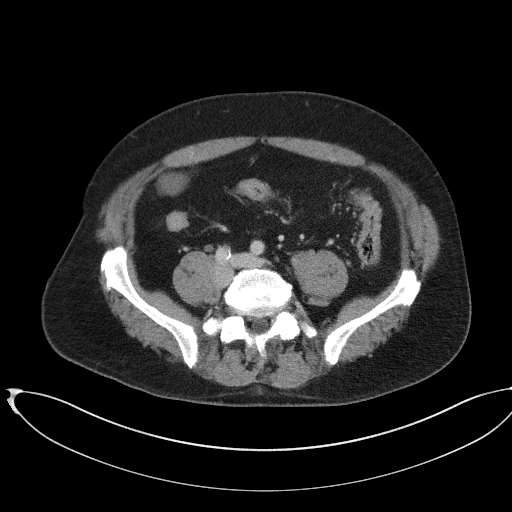
[im 51/108  soft-tissue]
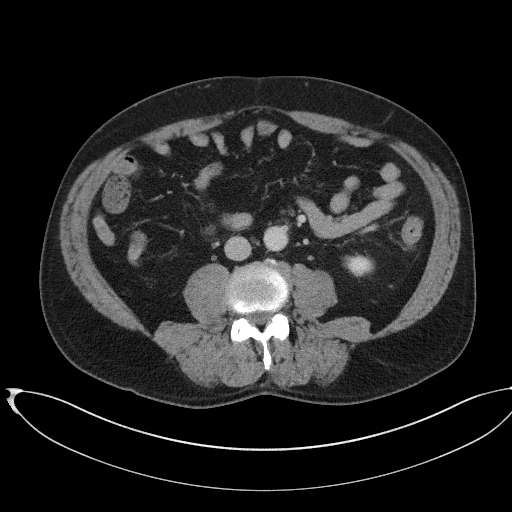
[im 57/108  soft-tissue]
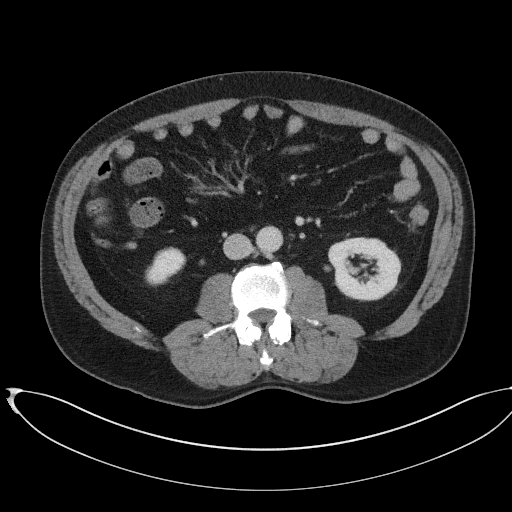
[im 68/108  soft-tissue]
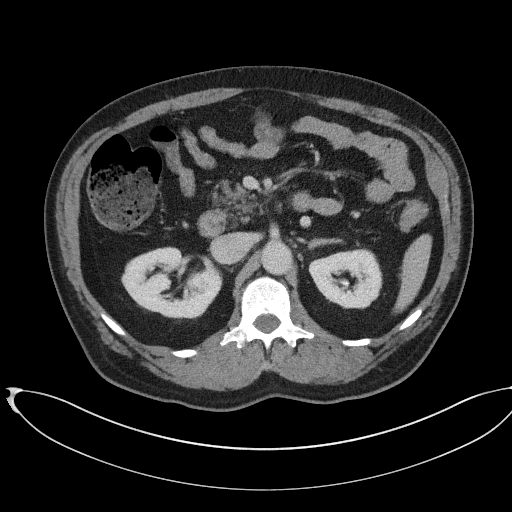
[im 74/108  soft-tissue]
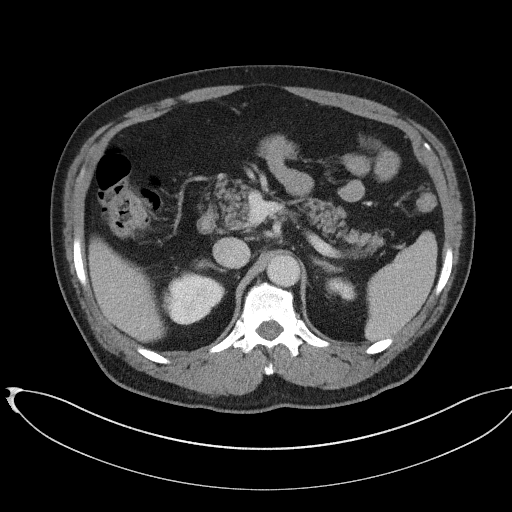
[im 74/108  bone]
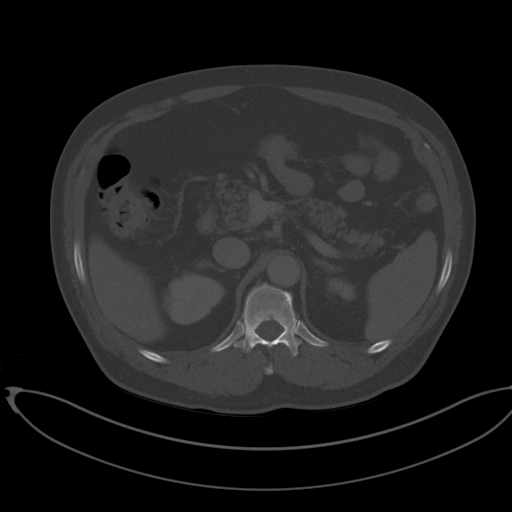
[im 85/108  soft-tissue]
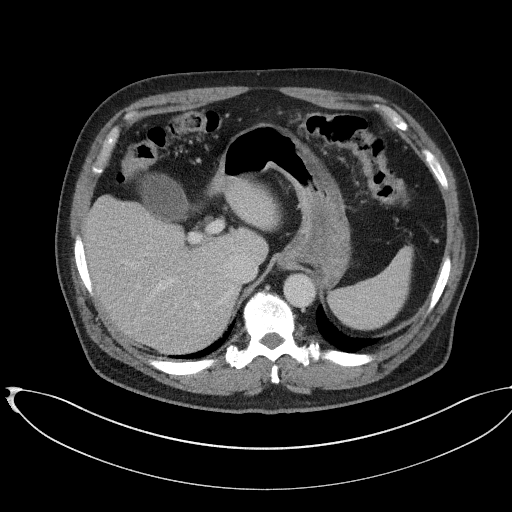
[im 91/108  soft-tissue]
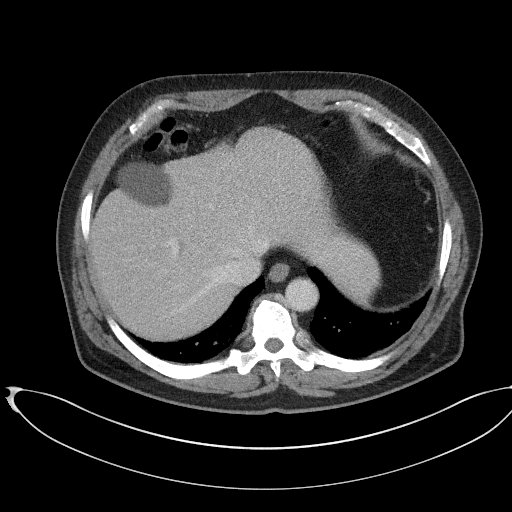
[im 102/108  soft-tissue]
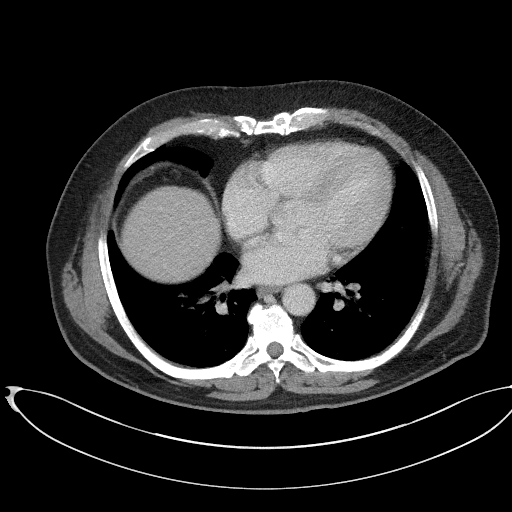

[Series 6: coronal st · coronal · 0.75mm/px · 3 of 99 slices shown]
[im 33/99  soft-tissue]
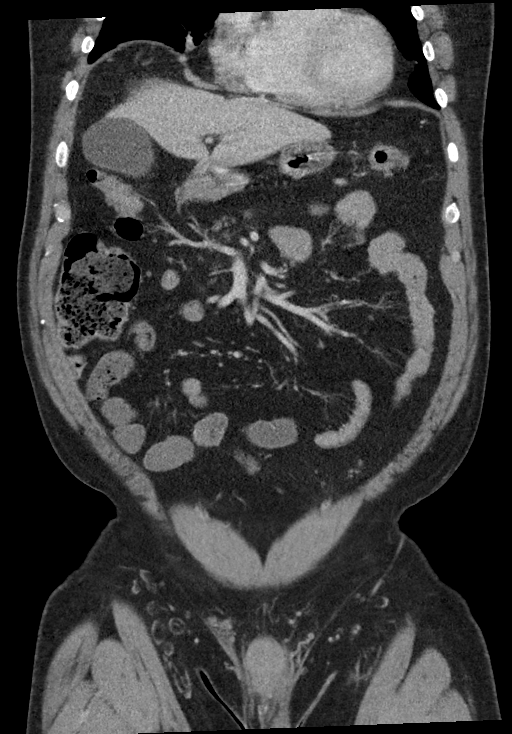
[im 44/99  soft-tissue]
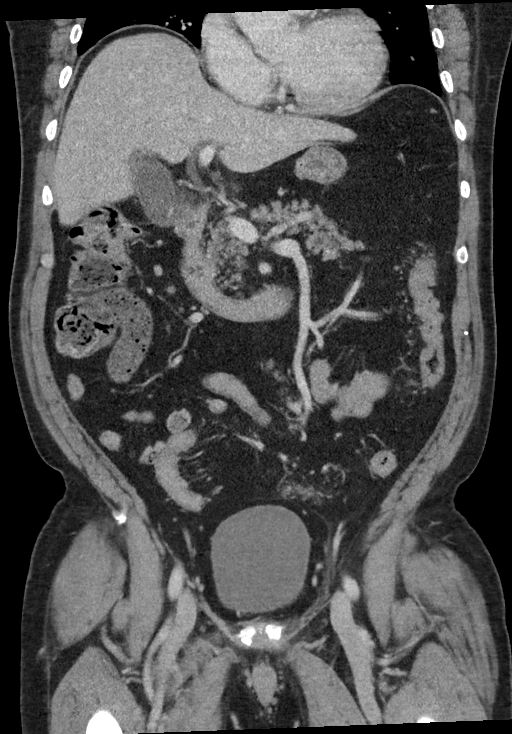
[im 55/99  soft-tissue]
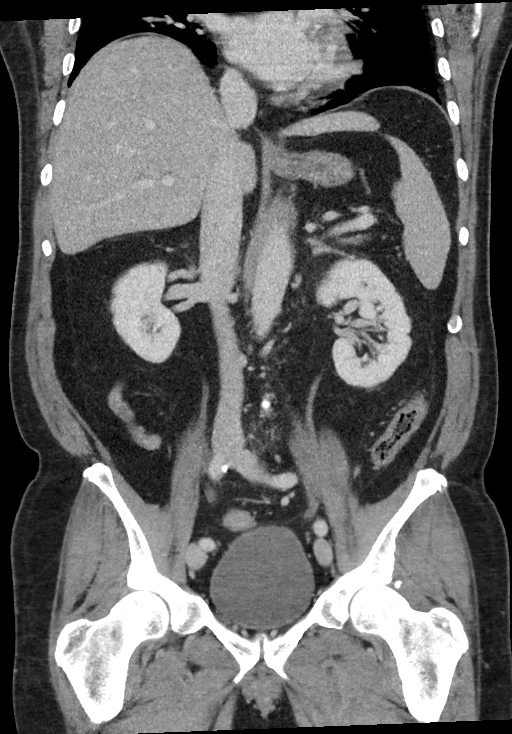

[15 of 46 positions shown; findings below may reference images not displayed]

FINDINGS: Lower chest: Dependent bibasilar subpleural atelectasis but no
infiltrates or effusions. The heart is within normal limits in size.
No pericardial effusion.

Hepatobiliary: No hepatic lesions or intrahepatic biliary
dilatation. The gallbladder is normal. No common bile duct
dilatation.

Pancreas: No mass, inflammation or ductal dilatation.

Spleen: Normal size.  No focal lesions.

Adrenals/Urinary Tract: The adrenal glands and kidneys are
unremarkable. No renal, ureteral or bladder calculi or mass.

Stomach/Bowel: The stomach, duodenum, small bowel and colon are
unremarkable without oral contrast. No acute inflammatory process,
mass lesions or obstructive findings. The terminal ileum is normal.
The appendix is normal.

Vascular/Lymphatic: The aorta is normal in caliber. No dissection.
The branch vessels are patent. The major venous structures are
patent. No mesenteric or retroperitoneal mass or adenopathy. Small
scattered lymph nodes are noted.

Reproductive: The prostate gland and seminal vesicles are
unremarkable.

Other: No pelvic mass or adenopathy. No free pelvic fluid
collections. No inguinal mass or adenopathy. No abdominal wall
hernia or subcutaneous lesions.

Musculoskeletal: No significant bony findings. Moderate to advanced
degenerative disc disease noted at L4-5.
IMPRESSION: No acute abdominal/pelvic findings, mass lesions or adenopathy.

## 2020-07-28 MED ORDER — SODIUM CHLORIDE 0.9 % IV BOLUS
1000.0000 mL | Freq: Once | INTRAVENOUS | Status: AC
  Administered 2020-07-28: 1000 mL via INTRAVENOUS

## 2020-07-28 MED ORDER — HYOSCYAMINE SULFATE SL 0.125 MG SL SUBL: 0.1250 mg | SUBLINGUAL_TABLET | Freq: Four times a day (QID) | SUBLINGUAL | 0 refills | Status: DC | PRN

## 2020-07-28 MED ORDER — HYDROCODONE-ACETAMINOPHEN 5-325 MG PO TABS: 1.0000 | ORAL_TABLET | Freq: Four times a day (QID) | ORAL | 0 refills | Status: AC | PRN

## 2020-07-28 MED ORDER — MORPHINE SULFATE (PF) 4 MG/ML IV SOLN
4.0000 mg | Freq: Once | INTRAVENOUS | Status: AC
  Administered 2020-07-28: 4 mg via INTRAVENOUS
  Filled 2020-07-28: qty 1

## 2020-07-28 MED ORDER — IOHEXOL 300 MG/ML  SOLN
100.0000 mL | Freq: Once | INTRAMUSCULAR | Status: AC | PRN
  Administered 2020-07-28: 100 mL via INTRAVENOUS
  Filled 2020-07-28: qty 100

## 2020-07-28 MED ORDER — ONDANSETRON 8 MG PO TBDP
8.0000 mg | ORAL_TABLET | Freq: Once | ORAL | Status: AC
  Administered 2020-07-28: 8 mg via ORAL
  Filled 2020-07-28: qty 1

## 2020-07-28 NOTE — ED Provider Notes (Signed)
[facility] Emergency Department Provider Note ____________________________________________   First MD Initiated Contact with Patient 07/28/20 1218     (approximate)  I have reviewed the triage vital signs and the nursing notes.   HISTORY  Chief Complaint Abdominal Pain  HPI Reginald Simpson is a 64 y.o. male with history as listed below presents to the emergency department for treatment and evaluation of abdominal pain and vomiting.  Patient states that he experienced a viral gastrointestinal illness that started approximately 1 week ago.  He had been taking Bentyl and Reglan which had provided some relief.  He had continued a brat diet until Thursday.  He started to resume his normal diet Thursday evening.  He did have wine that night and then yesterday had pizza and wine.  He states that he drinks approximately 3 to 4 glasses of wine per evening.    No previous history of pancreatitis or cirrhosis.   Past Medical History:  Diagnosis Date  . Colon polyps   . Hyperglycemia     Patient Active Problem List   Diagnosis Date Noted  . Depression, recurrent (HCC) 12/21/2019  . Elevated BP without diagnosis of hypertension 12/21/2019  . Hypothyroid 03/27/2016  . Obesity 03/27/2016  . Chest wall pain 03/27/2016  . Hyperglycemia 03/27/2016  . Situational stress 03/27/2016  . Family history of prostate cancer 06/17/2010  . Hematuria 01/22/2010  . History of adenomatous polyp of colon 02/04/2008    Past Surgical History:  Procedure Laterality Date  . BACK SURGERY  1994   Ruptured lumbar disk  . COLONOSCOPY  07/07/2013   Mild active colitis of Cecum, Hyperplastic polyp in rectum. [facility]  . SHOULDER ARTHROSCOPY Right 03/2017   Dr. Thomasena Edis,, [facility]  . SHOULDER ARTHROSCOPY Left 03/2019    Prior to Admission medications   Medication Sig Start Date End Date Taking? Authorizing Provider  aspirin 81 MG tablet Take 81 mg by  mouth daily.    [provider]  escitalopram (LEXAPRO) 10 MG tablet Take 1 tablet (10 mg total) by mouth daily. Start 1 tablet daily for one week, then increase to 1 1/2 tablets daily then increase to 2 tablets daily 05/14/20   Malva Limes, MD  HYDROcodone-acetaminophen (NORCO/VICODIN) 5-325 MG tablet Take 1 tablet by mouth every 6 (six) hours as needed for up to 3 days for severe pain. 07/28/20 07/31/20  Srihith Aquilino, Kasandra Knudsen, FNP  Hyoscyamine Sulfate SL (LEVSIN/SL) 0.125 MG SUBL Place 0.125 mg under the tongue 4 (four) times daily as needed (for nausea or abdominal pain). 07/28/20   Nydia Ytuarte, Rulon Eisenmenger B, FNP  levothyroxine (SYNTHROID) 125 MCG tablet Take 1 tablet (125 mcg total) by mouth daily. 08/21/19   Malva Limes, MD  metoCLOPramide (REGLAN) 10 MG tablet Take 1 tablet (10 mg total) by mouth every 6 (six) hours. 07/21/20   Moshe Cipro, NP  dicyclomine (BENTYL) 20 MG tablet Take 1 tablet (20 mg total) by mouth 4 (four) times daily as needed for spasms. 07/21/20 07/28/20  Moshe Cipro, NP    Allergies Patient has no known allergies.  Family History  Problem Relation Age of Onset  . Hypertension Mother   . Prostate cancer Father 70    Social History Social History   Tobacco Use  . Smoking status: Never Smoker  . Smokeless tobacco: Never Used  Vaping Use  . Vaping Use: Never used  Substance Use Topics  . Alcohol use: Yes    Alcohol/week: 0.0 standard  drinks    Comment: 2-3 beers or glasses of wine 3 night each week  . Drug use: No    Review of Systems  Constitutional: No fever/chills Eyes: No visual changes. ENT: No sore throat. Cardiovascular: Denies chest pain. Respiratory: Denies shortness of breath. Gastrointestinal: Positive for diffuse upper abdominal pain, bloating.  No diarrhea or constipation. Genitourinary: Negative for dysuria. Musculoskeletal: Negative for back pain. Skin: Negative for rash. Neurological: Negative for headaches, focal weakness  or numbness. ____________________________________________   PHYSICAL EXAM:  VITAL SIGNS: ED Triage Vitals  Enc Vitals Group     BP 07/28/20 0921 (!) 168/85     Pulse Rate 07/28/20 0917 61     Resp 07/28/20 0917 16     Temp 07/28/20 0917 98.4 F (36.9 C)     Temp Source 07/28/20 0917 Oral     SpO2 07/28/20 0917 97 %     Weight 07/28/20 0918 200 lb (90.7 kg)     Height 07/28/20 0918 5\' 8"  (1.727 m)     Head Circumference --      Peak Flow --      Pain Score 07/28/20 0918 8     Pain Loc --      Pain Edu? --      Excl. in GC? --     Constitutional: Alert and oriented. Well appearing and in no acute distress. Eyes: Conjunctivae are normal. PERRL. EOMI. Head: Atraumatic. Nose: No congestion/rhinnorhea. Mouth/Throat: Mucous membranes are moist.  Oropharynx non-erythematous. Neck: No stridor.   Hematological/Lymphatic/Immunilogical: No cervical lymphadenopathy. Cardiovascular: Normal rate, regular rhythm. Grossly normal heart sounds.  Good peripheral circulation. Respiratory: Normal respiratory effort.  No retractions. Lungs CTAB. Gastrointestinal: Soft and nontender. No distention. No abdominal bruits. No CVA tenderness. Genitourinary:  Musculoskeletal: No lower extremity tenderness nor edema.  No joint effusions. Neurologic:  Normal speech and language. No gross focal neurologic deficits are appreciated. No gait instability. Skin:  Skin is warm, dry and intact. No rash noted. Psychiatric: Mood and affect are normal. Speech and behavior are normal.  ____________________________________________   LABS (all labs ordered are listed, but only abnormal results are displayed)  Labs Reviewed  LIPASE, BLOOD - Abnormal; Notable for the following components:      Result Value   Lipase 95 (*)    All other components within normal limits  COMPREHENSIVE METABOLIC PANEL - Abnormal; Notable for the following components:   Glucose, Bld 161 (*)    AST 69 (*)    ALT 80 (*)    All other  components within normal limits  URINALYSIS, COMPLETE (UACMP) WITH MICROSCOPIC - Abnormal; Notable for the following components:   Color, Urine STRAW (*)    APPearance CLEAR (*)    pH 9.0 (*)    All other components within normal limits  CBC  HEPATITIS PANEL, ACUTE   ____________________________________________  EKG  Not indicated ____________________________________________  RADIOLOGY  ED MD interpretation:    No evidence of abdominal or pelvic mass, lesion, adenopathy or cholecystitis I, Jaima Janney, personally viewed and evaluated these images (plain radiographs) as part of my medical decision making, as well as reviewing the written report by the radiologist.  Official radiology report(s): CT Abdomen Pelvis W Contrast  Result Date: 07/28/2020 CLINICAL DATA:  Abdominal pain and vomiting. EXAM: CT ABDOMEN AND PELVIS WITH CONTRAST TECHNIQUE: Multidetector CT imaging of the abdomen and pelvis was performed using the standard protocol following bolus administration of intravenous contrast. CONTRAST:  100mL OMNIPAQUE IOHEXOL 300 MG/ML  SOLN  COMPARISON:  None. FINDINGS: Lower chest: Dependent bibasilar subpleural atelectasis but no infiltrates or effusions. The heart is within normal limits in size. No pericardial effusion. Hepatobiliary: No hepatic lesions or intrahepatic biliary dilatation. The gallbladder is normal. No common bile duct dilatation. Pancreas: No mass, inflammation or ductal dilatation. Spleen: Normal size.  No focal lesions. Adrenals/Urinary Tract: The adrenal glands and kidneys are unremarkable. No renal, ureteral or bladder calculi or mass. Stomach/Bowel: The stomach, duodenum, small bowel and colon are unremarkable without oral contrast. No acute inflammatory process, mass lesions or obstructive findings. The terminal ileum is normal. The appendix is normal. Vascular/Lymphatic: The aorta is normal in caliber. No dissection. The branch vessels are patent. The major venous  structures are patent. No mesenteric or retroperitoneal mass or adenopathy. Small scattered lymph nodes are noted. Reproductive: The prostate gland and seminal vesicles are unremarkable. Other: No pelvic mass or adenopathy. No free pelvic fluid collections. No inguinal mass or adenopathy. No abdominal wall hernia or subcutaneous lesions. Musculoskeletal: No significant bony findings. Moderate to advanced degenerative disc disease noted at L4-5. IMPRESSION: No acute abdominal/pelvic findings, mass lesions or adenopathy. Electronically Signed   By: Rudie Meyer M.D.   On: 07/28/2020 13:57    ____________________________________________   PROCEDURES  Procedure(s) performed (including Critical Care):  Procedures  ____________________________________________   INITIAL IMPRESSION / ASSESSMENT AND PLAN     64 year old male presenting to the emergency department for treatment and evaluation of abdominal pain and bloating with a couple episodes of vomiting this morning.  See HPI for further details.  While awaiting ER room assignment, labs were drawn which show an elevated lipase at 95, elevated glucose at 161, elevated AST and ALT at 69 and 80.  Patient reports that his pain is still significant.  Plan will be to order IV fluids, antiemetic, pain medication and obtain a CT of the abdomen and pelvis with contrast.  Patient made aware the plan and is agreeable.  DIFFERENTIAL DIAGNOSIS  Pancreatitis, liver disease  ED COURSE   Clinical Course as of Jul 29 1511  Sat Jul 28, 2020  1351 Significant improvement in pain and decrease in nausea after medication. Awaiting urinalysis and result of CT. Wife currently at bedside. IV fluids infusing.    [CT]  1413 Urinalysis, Complete w Microscopic [CT]  1457 CT is reassuring.  No evidence of abdominal or pelvic mass, lesion, adenopathy or evidence of cholecystitis, choledocholithiasis, calculi, appendicitis, or free fluid.  Will discharge him with  instruction maintain clear liquid diet for 24 hours then progress to bland diet and progress as tolerated and to avoid alcohol. He is to follow up with primary care and the GI specialist. For symptoms of concern he is to return to the ER if unable to see PCP or GI.   [CT]    Clinical Course User Index [CT] Ariana Juul B, FNP   ___________________________________________   FINAL CLINICAL IMPRESSION(S) / ED DIAGNOSES  Final diagnoses:  Acute pancreatitis without infection or necrosis, unspecified pancreatitis type     ED Discharge Orders         Ordered    Hyoscyamine Sulfate SL (LEVSIN/SL) 0.125 MG SUBL  4 times daily PRN        07/28/20 1452    HYDROcodone-acetaminophen (NORCO/VICODIN) 5-325 MG tablet  Every 6 hours PRN        07/28/20 1452           LONG BRIMAGE was evaluated in Emergency Department on 07/28/2020 for the  symptoms described in the history of present illness. He was evaluated in the context of the global COVID-19 pandemic, which necessitated consideration that the patient might be at risk for infection with the SARS-CoV-2 virus that causes COVID-19. Institutional protocols and algorithms that pertain to the evaluation of patients at risk for COVID-19 are in a state of rapid change based on information released by regulatory bodies including the CDC and federal and state organizations. These policies and algorithms were followed during the patient's care in the ED.   Note:  This document was prepared using Dragon voice recognition software and may include unintentional dictation errors.   Chinita Pester, FNP 07/28/20 1512    Dionne Bucy, MD 07/28/20 1527

## 2020-07-28 NOTE — ED Notes (Signed)
Pt c/o of constant abdominal pain in upper right and left quadrants that started today. Pt states he threw up once in the lobby, denies diarrhea. Pt reports he was seen last week for diarrhea and was given medications that helped (does not remember what medications he took). Pt denies pain or difficulty with urination, denies abnormal BM. Abdomen is round, soft. No distention noted.

## 2020-07-28 NOTE — ED Triage Notes (Signed)
Pt to ED via POV, pt states that last week he had stomach virus, symptoms had gotten better. This morning he woke up with severe abd pain. Pt has had 1 episode of vomiting when he got to the ED. Pt states that nausea is better since he vomited. Pt is in NAD. Pt states that he did take tums this morning as well medication he was given last week for abdominal cramping.

## 2020-07-28 NOTE — Discharge Instructions (Signed)
Please follow-up with your primary care provider as well as the gastroenterologist.  Clear liquids for the next 24 hours then progress as tolerated.  If your pain gets worse and you are unable to see primary care or the specialist, please return to the emergency department.

## 2020-08-06 ENCOUNTER — Other Ambulatory Visit: Payer: Self-pay | Admitting: Family Medicine

## 2020-09-07 NOTE — Progress Notes (Signed)
Established patient visit   Patient: Reginald Simpson   DOB: 03/16/1956   64 y.o. Male  MRN: [id number] Visit Date: 09/10/2020  Today's healthcare provider: [name], MD   Chief Complaint  Patient presents with  . Depression  . Blood Pressure Check   Subjective    HPI  Depression and stress, Follow-up  He  was last seen for this 3 months ago. During that visit it was noted that patient was doing better with change from sertraline to escitalopram. At that time he had a full bottle of 10mg  tablets and was taking 2 daily. He didn't notice too much different titrating up from 10mg  to 20mg . Advised if he had persistent side effects he could reduce to 15 or 10mg  a day as needed. Otherwise is to follow up in 3-4 months.  He reports good compliance with treatment. He is not having side effects.   He reports good tolerance of treatment. Current symptoms include: depressed mood He feels he is stable since last visit.  Depression screen [facility] 2/9 09/10/2020 05/30/2020 04/20/2020  Decreased Interest 1 1 3   Down, Depressed, Hopeless 1 1 3   PHQ - 2 Score 2 2 6   Altered sleeping 1 1 3   Tired, decreased energy 1 2 2   Change in appetite 0 0 0  Feeling bad or failure about yourself  0 1 3  Trouble concentrating 1 1 3   Moving slowly or fidgety/restless 0 0 0  Suicidal thoughts 0 0 1  PHQ-9 Score 5 7 18   Difficult doing work/chores Not difficult at all Not difficult at all Not difficult at all    -----------------------------------------------------------------------------------------  Follow up for elevated blood pressure:  The patient was last seen for this 8 months ago. Management during that visit includes advising patient to work on healthier diet and weight loss.   He reports good compliance with treatment. He feels that condition is Unchanged. He is not having side effects.    -----------------------------------------------------------------------------------------  Hypothyroid, follow-up  Lab Results  Component Value Date   TSH 3.060 09/19/2019   TSH 5.640 (H) 07/12/2019   TSH 2.120 12/01/2017   FREET4 1.2 08/28/2017   T4TOTAL 7.0 05/27/2017   T4TOTAL 6.9 03/31/2016   Wt Readings from Last 3 Encounters:  09/10/20 212 lb (96.2 kg)  07/28/20 200 lb (90.7 kg)  07/21/20 200 lb (90.7 kg)    He was last seen for hypothyroid 1 year ago.  Management since that visit includes continue same medication. He reports good compliance with treatment. He is not having side effects.   Symptoms: No change in energy level No constipation  No diarrhea No heat / cold intolerance  No nervousness No palpitations  No weight changes    -----------------------------------------------------------------------------------------      Medications: Outpatient Medications Prior to Visit  Medication Sig  . aspirin 81 MG tablet Take 81 mg by mouth daily.  escitalopram (LEXAPRO) 10 MG tablet TAKE 1 TABLET DAILY FOR 1 WEEK, THEN 1 & 1/2 TABLETS DAILY THEN INCREASE TO 2 TABLETS DAILY  . levothyroxine (SYNTHROID) 125 MCG tablet Take 1 tablet (125 mcg total) by mouth daily.  . [DISCONTINUED] Hyoscyamine Sulfate SL (LEVSIN/SL) 0.125 MG SUBL Place 0.125 mg under the tongue 4 (four) times daily as needed (for nausea or abdominal pain).  . [DISCONTINUED] metoCLOPramide (REGLAN) 10 MG tablet Take 1 tablet (10 mg total) by mouth every 6 (six) hours.   No facility-administered medications prior to visit.    Review of  Systems  Constitutional: Negative for appetite change, chills and fever.  Respiratory: Negative for chest tightness, shortness of breath and wheezing.   Cardiovascular: Negative for chest pain and palpitations.  Gastrointestinal: Negative for abdominal pain, nausea and vomiting.      Objective    BP (!) 163/93 (BP Location: Right Arm, Patient Position:  Sitting, Cuff Size: Large)   Pulse (!) 51   Temp 98.1 F (36.7 C) (Oral)   Resp 16   Wt 212 lb (96.2 kg)   BMI 32.23 kg/m    Physical Exam   General appearance: Obese male, cooperative and in no acute distress Head: Normocephalic, without obvious abnormality, atraumatic Respiratory: Respirations even and unlabored, normal respiratory rate Extremities: All extremities are intact.  Skin: Skin color, texture, turgor normal. No rashes seen  Psych: Appropriate mood and affect. Neurologic: Mental status: Alert, oriented to person, place, and time, thought content appropriate.   No results found for any visits on 09/10/20.  Assessment & Plan     1. Primary hypertension He reports he is following healthy low sodium diet and is physically active, but does have family history of hypertension. Will start - amLODipine (NORVASC) 5 MG tablet; Take 1 tablet (5 mg total) by mouth daily.  Dispense: 90 tablet; Refill: 1  Follow up for BP check in about 3 months.  - Lipid panel  2. Depression, recurrent (HCC) Doing well with escitalopram, he feels 20mg  a day is working well and has just refill 10mg  tablet, so will continue taking 2 x 10mg  daily. Advised to let me know if he wants to change to 20mg  tablets.   3. Hypothyroidism, unspecified type  - TSH  4. Prostate cancer screening  - PSA  5. Hyperglycemia  - Hemoglobin A1c         The entirety of the information documented in the History of Present Illness, Review of Systems and Physical Exam were personally obtained by me. Portions of this information were initially documented by the CMA and reviewed by me for thoroughness and accuracy.      , MD  [facility] [phone] (phone) [phone] (fax)  [facility]

## 2020-09-10 ENCOUNTER — Other Ambulatory Visit: Payer: Self-pay

## 2020-09-10 ENCOUNTER — Ambulatory Visit (INDEPENDENT_AMBULATORY_CARE_PROVIDER_SITE_OTHER): Payer: No Typology Code available for payment source | Admitting: Family Medicine

## 2020-09-10 ENCOUNTER — Encounter: Payer: Self-pay | Admitting: Family Medicine

## 2020-09-10 VITALS — BP 163/93 | HR 51 | Temp 98.1°F | Resp 16 | Wt 212.0 lb

## 2020-09-10 DIAGNOSIS — E039 Hypothyroidism, unspecified: Secondary | ICD-10-CM | POA: Diagnosis not present

## 2020-09-10 DIAGNOSIS — Z125 Encounter for screening for malignant neoplasm of prostate: Secondary | ICD-10-CM

## 2020-09-10 DIAGNOSIS — R739 Hyperglycemia, unspecified: Secondary | ICD-10-CM

## 2020-09-10 DIAGNOSIS — F339 Major depressive disorder, recurrent, unspecified: Secondary | ICD-10-CM

## 2020-09-10 DIAGNOSIS — I10 Essential (primary) hypertension: Secondary | ICD-10-CM

## 2020-09-10 MED ORDER — AMLODIPINE BESYLATE 5 MG PO TABS: 5.0000 mg | ORAL_TABLET | Freq: Every day | ORAL | 1 refills | Status: DC

## 2020-09-10 NOTE — Patient Instructions (Signed)
.   Please review the attached list of medications and notify my office if there are any errors.   Please go to the lab draw station in [address] on the second floor of [facility] on or after November 2nd, 2021 when you are fasting for 8 hours. Normal hours are 8:00am to 11:30am and 1:00pm to 4:00pm Monday through Friday

## 2020-10-04 LAB — TSH: TSH: 2.56 u[IU]/mL (ref 0.450–4.500)

## 2020-10-04 LAB — LIPID PANEL
Chol/HDL Ratio: 3.8 ratio (ref 0.0–5.0)
Cholesterol, Total: 234 mg/dL — ABNORMAL HIGH (ref 100–199)
HDL: 61 mg/dL (ref >39)
LDL Chol Calc (NIH): 146 mg/dL — ABNORMAL HIGH (ref 0–99)
Triglycerides: 151 mg/dL — ABNORMAL HIGH (ref 0–149)
VLDL Cholesterol Cal: 27 mg/dL (ref 5–40)

## 2020-10-04 LAB — HEMOGLOBIN A1C
Est. average glucose Bld gHb Est-mCnc: 117 mg/dL
Hgb A1c MFr Bld: 5.7 % — ABNORMAL HIGH (ref 4.8–5.6)

## 2020-10-04 LAB — PSA: Prostate Specific Ag, Serum: 1.6 ng/mL (ref 0.0–4.0)

## 2020-10-14 ENCOUNTER — Encounter: Payer: Self-pay | Admitting: Family Medicine

## 2020-11-13 ENCOUNTER — Other Ambulatory Visit: Payer: Self-pay | Admitting: Family Medicine

## 2020-11-14 ENCOUNTER — Other Ambulatory Visit: Payer: Self-pay | Admitting: Family Medicine

## 2020-11-14 DIAGNOSIS — E039 Hypothyroidism, unspecified: Secondary | ICD-10-CM

## 2020-12-12 ENCOUNTER — Ambulatory Visit: Payer: Self-pay | Admitting: Family Medicine

## 2021-03-09 ENCOUNTER — Encounter: Payer: Self-pay | Admitting: Family Medicine

## 2021-03-09 DIAGNOSIS — R739 Hyperglycemia, unspecified: Secondary | ICD-10-CM

## 2021-03-09 DIAGNOSIS — E785 Hyperlipidemia, unspecified: Secondary | ICD-10-CM

## 2021-03-09 DIAGNOSIS — F439 Reaction to severe stress, unspecified: Secondary | ICD-10-CM

## 2021-03-09 DIAGNOSIS — F339 Major depressive disorder, recurrent, unspecified: Secondary | ICD-10-CM

## 2021-03-09 DIAGNOSIS — E039 Hypothyroidism, unspecified: Secondary | ICD-10-CM

## 2021-03-20 MED ORDER — ESCITALOPRAM OXALATE 10 MG PO TABS: 10.0000 mg | ORAL_TABLET | Freq: Every day | ORAL | 1 refills | Status: DC

## 2021-03-20 NOTE — Addendum Note (Signed)
Addended by: Malva Limes on: 03/20/2021 02:41 PM   Modules accepted: Orders

## 2021-03-22 LAB — LIPID PANEL
Chol/HDL Ratio: 5.1 ratio — ABNORMAL HIGH (ref 0.0–5.0)
Cholesterol, Total: 238 mg/dL — ABNORMAL HIGH (ref 100–199)
HDL: 47 mg/dL (ref >39)
LDL Chol Calc (NIH): 113 mg/dL — ABNORMAL HIGH (ref 0–99)
Triglycerides: 450 mg/dL — ABNORMAL HIGH (ref 0–149)
VLDL Cholesterol Cal: 78 mg/dL — ABNORMAL HIGH (ref 5–40)

## 2021-03-22 LAB — HEMOGLOBIN A1C
Est. average glucose Bld gHb Est-mCnc: 120 mg/dL
Hgb A1c MFr Bld: 5.8 % — ABNORMAL HIGH (ref 4.8–5.6)

## 2021-03-22 LAB — TSH: TSH: 6.18 u[IU]/mL — ABNORMAL HIGH (ref 0.450–4.500)

## 2021-05-15 ENCOUNTER — Other Ambulatory Visit: Payer: Self-pay | Admitting: Family Medicine

## 2021-05-15 DIAGNOSIS — E039 Hypothyroidism, unspecified: Secondary | ICD-10-CM

## 2021-05-15 MED ORDER — LEVOTHYROXINE SODIUM 137 MCG PO TABS: 125.0000 ug | ORAL_TABLET | Freq: Every day | ORAL | 1 refills | Status: DC

## 2021-05-21 ENCOUNTER — Ambulatory Visit: Payer: Self-pay | Admitting: *Deleted

## 2021-05-21 NOTE — Telephone Encounter (Signed)
Patient called and he says since Thursday, June 30th he's been having drainage out of the right eye. He says the eye has gotten worse with the drainage, swelling, redness since then. He denies pain. He says it's swollen shut and it's difficult to see out of, so he keeps it patched up. He says he's been using compresses and eye drops once. He says his wife was able to get an appointment for today with the eye doctor, so he will be going there. I advised to keep that appointment and if the treatment that is given by the eye doctor is not working to call us back for an appointment. Patient verbalized understanding.     Message from Fanny Bien sent at 05/21/2021  9:10 AM EDT  Patient calling back. Patient will like a call back. He states that he has his phone on him this time and will answer.   ----- Message from Traci Sermon sent at 05/21/2021  8:18 AM EDT -----  Pt wife called in stating pt was having some eye issue, and having some discharge coming out, pt wanted to get an appt but, wanted something today, pt wants to know what can be done. Please advise    Reason for Disposition  [1] Eye with yellow or green discharge, or eyelashes stick together AND [2] NO PCP standing order to call in antibiotic eye drops  (Exception: [place]; continue triage.)  Answer Assessment - Initial Assessment Questions 1. EYE DISCHARGE: "Is the discharge in one or both eyes?" "What color is it?" "How much is there?" "When did the discharge start?"      Right eye starting on June 30 2. REDNESS OF SCLERA: "Is the redness in one or both eyes?" "When did the redness start?"     Yes to the right eye starting on June 30 3. EYELIDS: "Are the eyelids red or swollen?" If Yes, ask: "How much?"      Yes and red, swollen shut since Sunday 05/19/21 4. VISION: "Is there any difficulty seeing clearly?"      Yes hard to see when at the computer 5. PAIN: "Is there any pain? If Yes, ask: "How bad is it?" (Scale 1-10; or mild,  moderate, severe)    - MILD (1-3): doesn't interfere with normal activities     - MODERATE (4-7): interferes with normal activities or awakens from sleep    - SEVERE (8-10): excruciating pain, unable to do any normal activities       No 6. CONTACT LENS: "Do you wear contacts?"     Not for years 7. OTHER SYMPTOMS: "Do you have any other symptoms?" (e.g., fever, runny nose, cough)     Eye itches 8. PREGNANCY: "Is there any chance you are pregnant?" "When was your last menstrual period?"     N/A  Protocols used: Eye - Pus or Discharge-A-AH

## 2021-10-07 ENCOUNTER — Other Ambulatory Visit: Payer: Self-pay | Admitting: Family Medicine

## 2021-10-07 DIAGNOSIS — F439 Reaction to severe stress, unspecified: Secondary | ICD-10-CM

## 2021-10-07 DIAGNOSIS — F339 Major depressive disorder, recurrent, unspecified: Secondary | ICD-10-CM

## 2021-10-07 DIAGNOSIS — I10 Essential (primary) hypertension: Secondary | ICD-10-CM

## 2021-11-20 ENCOUNTER — Other Ambulatory Visit: Payer: Self-pay

## 2021-11-20 ENCOUNTER — Ambulatory Visit (INDEPENDENT_AMBULATORY_CARE_PROVIDER_SITE_OTHER): Payer: No Typology Code available for payment source | Admitting: Urology

## 2021-11-20 ENCOUNTER — Encounter: Payer: Self-pay | Admitting: Urology

## 2021-11-20 VITALS — BP 130/80 | HR 74 | Ht 68.0 in | Wt 215.0 lb

## 2021-11-20 DIAGNOSIS — R310 Gross hematuria: Secondary | ICD-10-CM | POA: Diagnosis not present

## 2021-11-20 LAB — URINALYSIS, COMPLETE
Bilirubin, UA: NEGATIVE
Glucose, UA: NEGATIVE
Leukocytes,UA: NEGATIVE
Nitrite, UA: NEGATIVE
Specific Gravity, UA: >1.03 — ABNORMAL HIGH (ref 1.005–1.030)
Urobilinogen, Ur: 0.2 mg/dL (ref 0.2–1.0)
pH, UA: 5.5 (ref 5.0–7.5)

## 2021-11-20 LAB — MICROSCOPIC EXAMINATION

## 2021-11-20 NOTE — Progress Notes (Signed)
° °  11/20/2021 4:26 PM   Reginald Simpson [birth year] [id number]  Referring provider: Birdie Sons, MD [address] [place],  [place] [address]  Chief Complaint  Patient presents with   Hematuria    HPI: Reginald Simpson a 66 y.o. male self-referred for evaluation of gross hematuria.  Last week had onset of total gross painless hematuria States there was a significant amount of blood present with some clots present Hematuria resolved after 7 days and the last blood he saw was on 11/15/2021 No flank, abdominal or pelvic pain No blood thinners, antiplatelet or anticoagulant medication No bothersome LUTS or dysuria Due for annual PSA with his PCP; last PSA 09/2020 was 1.6   PMH: Past Medical History:  Diagnosis Date   Colon polyps    Hyperglycemia     Surgical History: Past Surgical History:  Procedure Laterality Date   BACK SURGERY  1994   Ruptured lumbar disk   COLONOSCOPY  07/07/2013   Mild active colitis of Cecum, Hyperplastic polyp in rectum. [facility]   SHOULDER ARTHROSCOPY Right 03/2017   Dr. Theda Sers,, [place] ARTHROSCOPY Left 03/2019    Home Medications:  Allergies as of 11/20/2021   No Known Allergies      Medication List        Accurate as of November 20, 2021  4:26 PM. If you have any questions, ask your nurse or doctor.          amLODipine 5 MG tablet Commonly known as: NORVASC Take 1 tablet (5 mg total) by mouth daily.   aspirin 81 MG tablet Take 81 mg by mouth daily.   escitalopram 10 MG tablet Commonly known as: LEXAPRO Take 1 tablet (10 mg total) by mouth daily.   levothyroxine 137 MCG tablet Commonly known as: SYNTHROID Take 1 tablet (137 mcg total) by mouth daily.        Allergies: No Known Allergies  Family History: Family History  Problem Relation Age of Onset   Hypertension Mother    Prostate cancer Father 46    Social History:  reports that he has never smoked.  He has never used smokeless tobacco. He reports current alcohol use. He reports that he does not use drugs.   Physical Exam: BP 130/80    Pulse 74    Ht 5\' 8"  (1.727 m)    Wt 215 lb (97.5 kg)    BMI 32.69 kg/m   Constitutional:  Alert, No acute distress. HEENT: [place] AT, moist mucus membranes.  Trachea midline, no masses. Cardiovascular: No clubbing, cyanosis, or edema. Respiratory: Normal respiratory effort, no increased work of breathing. Neurologic: Grossly intact, no focal deficits, moving all 4 extremities. Psychiatric: Normal mood and affect.  Laboratory Data:  Urinalysis Appearance-yellow/clear Dipstick-trace ketone/trace blood/trace protein Microscopy-negative   Assessment & Plan:    1. Gross hematuria 1 week episode total gross painless hematuria, resolved UA today negative AUA hematuria risk ratification: High We discussed the standard recommended evaluation of high risk hematuria to include CT urogram and cystoscopy The procedures were discussed and he desires to proceed   Abbie Sons, MD  [facility] [address] [place], [place] [address] [phone]

## 2021-11-29 ENCOUNTER — Other Ambulatory Visit: Payer: Self-pay | Admitting: Family Medicine

## 2021-11-29 DIAGNOSIS — E039 Hypothyroidism, unspecified: Secondary | ICD-10-CM

## 2021-11-29 NOTE — Telephone Encounter (Signed)
Requested medication (s) are due for refill today: yes  Requested medication (s) are on the active medication list: yes  Last refill:  09/02/21  Future visit scheduled: 12/23/21  Notes to clinic:  Has already had a curtesy refill, there is an upcoming appointment scheduled 12/23/21, please assess.   Requested Prescriptions  Pending Prescriptions Disp Refills   levothyroxine (SYNTHROID) 137 MCG tablet [Pharmacy Med Name: LEVOTHYROXINE 137 MCG TABLET] 90 tablet 1    Sig: TAKE 1 TABLET BY MOUTH EVERY DAY     Endocrinology:  Hypothyroid Agents Failed - 11/29/2021  1:49 AM      Failed - TSH needs to be rechecked within 3 months after an abnormal result. Refill until TSH is due.      Failed - TSH in normal range and within 360 days    TSH  Date Value Ref Range Status  03/21/2021 6.180 (H) 0.450 - 4.500 uIU/mL Final          Failed - Valid encounter within last 12 months    Recent Outpatient Visits           1 year ago Primary hypertension   [facility] Birdie Sons, MD   1 year ago Recurrent major depressive disorder, in full remission [facility])   [facility] Birdie Sons, MD   1 year ago Depression, unspecified depression type   [facility] Birdie Sons, MD   1 year ago Recurrent major depressive disorder, in partial remission [facility])   [facility] Birdie Sons, MD   2 years ago Depression, unspecified depression type   [place], Wendee Beavers, [place]       Future Appointments             In 3 weeks Fisher, Kirstie Peri, MD [facility], [place]   In 4 weeks [place], Ronda Fairly, [place]

## 2021-12-18 ENCOUNTER — Ambulatory Visit
Admission: RE | Admit: 2021-12-18 | Discharge: 2021-12-18 | Disposition: A | Discharge status: Home / Self Care | Payer: No Typology Code available for payment source | Source: Ambulatory Visit | Attending: Urology | Admitting: Urology

## 2021-12-18 ENCOUNTER — Other Ambulatory Visit: Payer: Self-pay

## 2021-12-18 DIAGNOSIS — R310 Gross hematuria: Secondary | ICD-10-CM | POA: Insufficient documentation

## 2021-12-18 LAB — POCT I-STAT CREATININE: Creatinine, Ser: 0.9 mg/dL (ref 0.61–1.24)

## 2021-12-18 IMAGING — CT CT ABD-PEL WO/W CM
3 of 12 series · 11 of 46 positions shown, 17 images · IV contrast (agent unspecified)
Comparison: CT abdomen and pelvis [DATE]

CLINICAL DATA: Gross hematuria

EXAM:
CT ABDOMEN AND PELVIS WITHOUT AND WITH CONTRAST
TECHNIQUE: Multidetector CT imaging of the abdomen and pelvis was performed
following the standard protocol before and following the bolus
administration of intravenous contrast.

[Series 2: abd without pre 5.00 · axial · non-contrast · 0.74mm/px · z∈[-1515,-1105]mm · 7 of 110 slices shown, 12 images]
[im 14/110  soft-tissue]
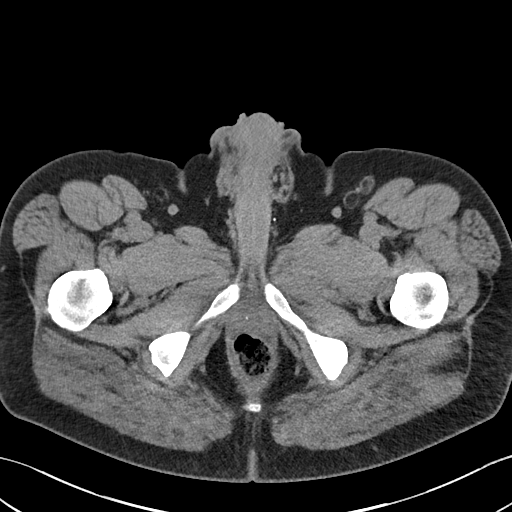
[im 14/110  bone]
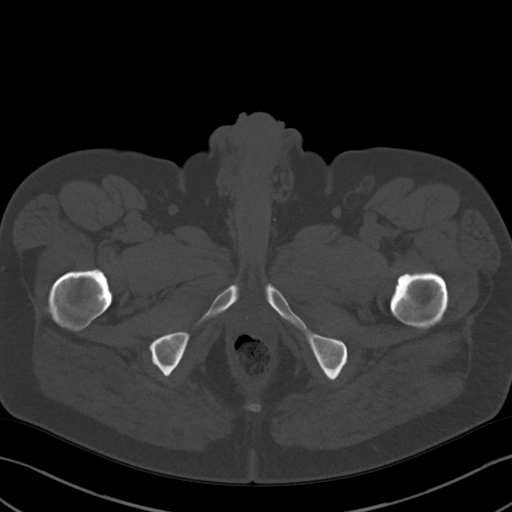
[im 28/110  soft-tissue]
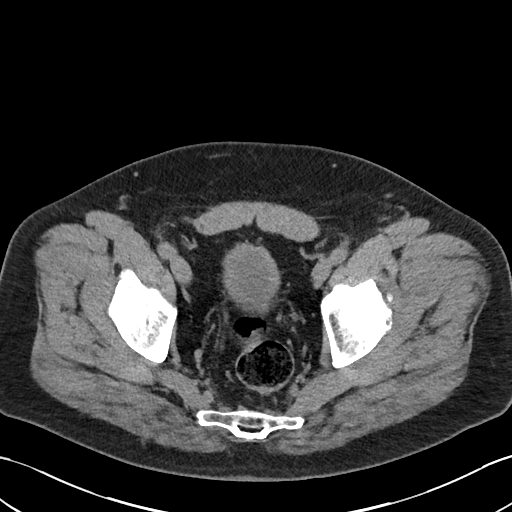
[im 41/110  soft-tissue]
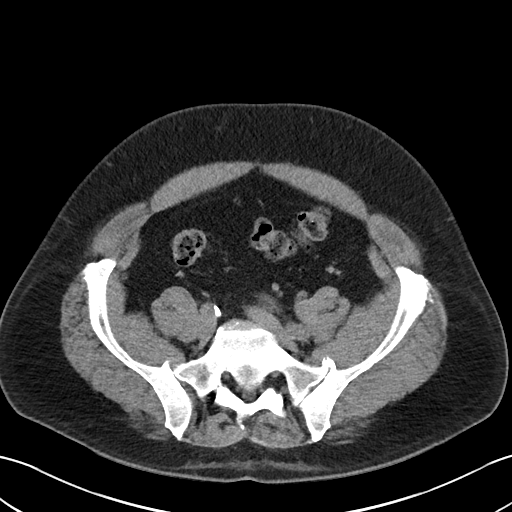
[im 55/110  soft-tissue]
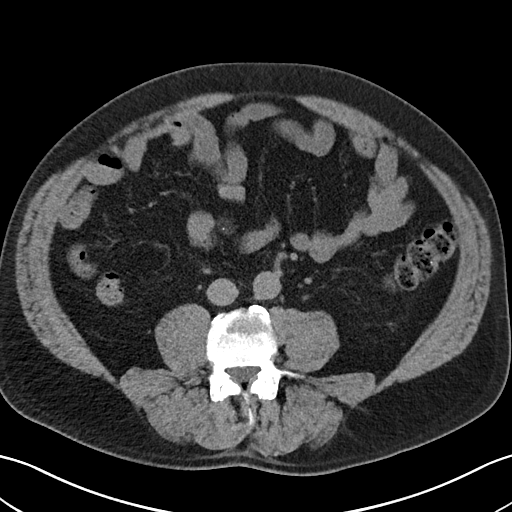
[im 55/110  lung]
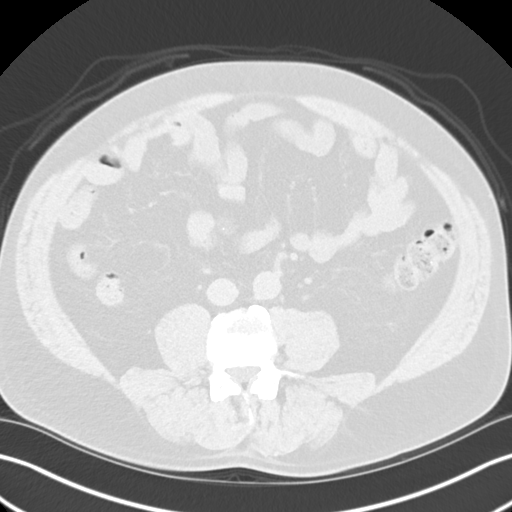
[im 69/110  soft-tissue]
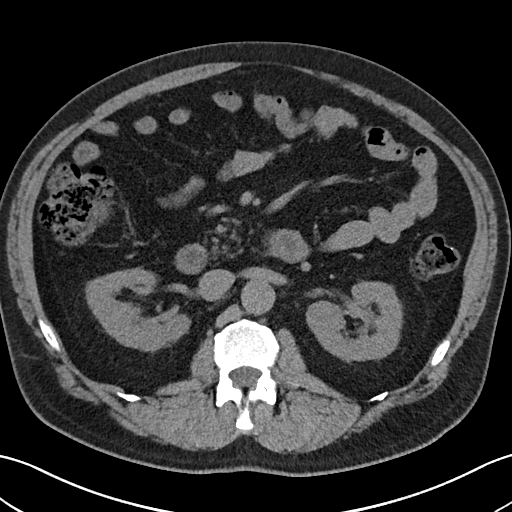
[im 69/110  lung]
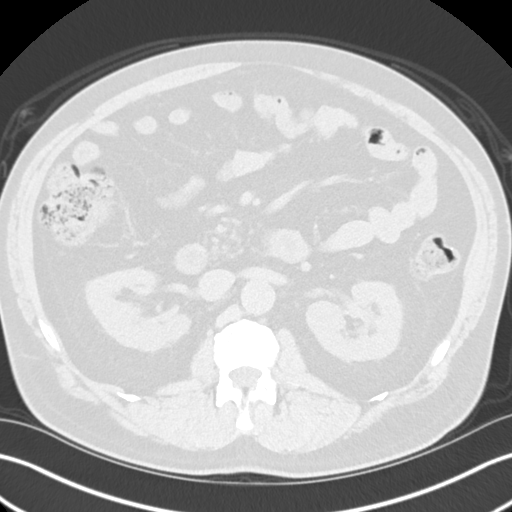
[im 82/110  soft-tissue]
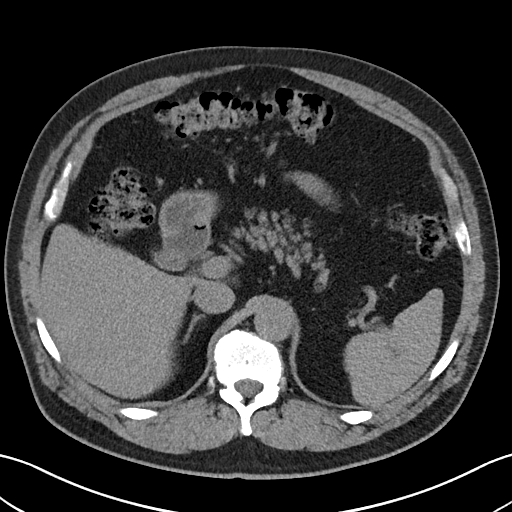
[im 82/110  lung]
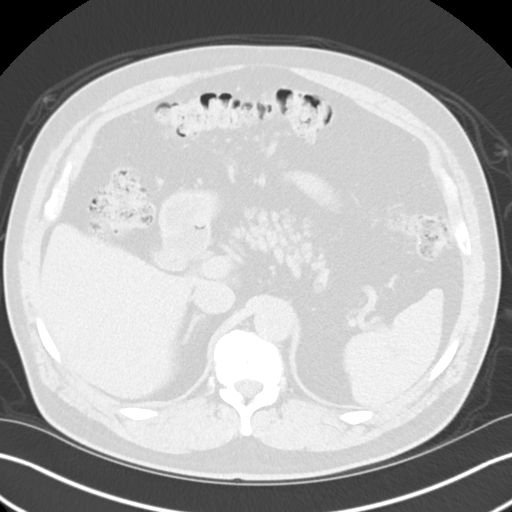
[im 96/110  soft-tissue]
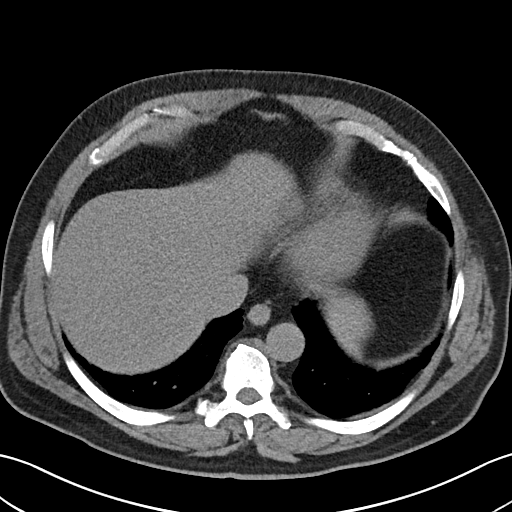
[im 96/110  lung]
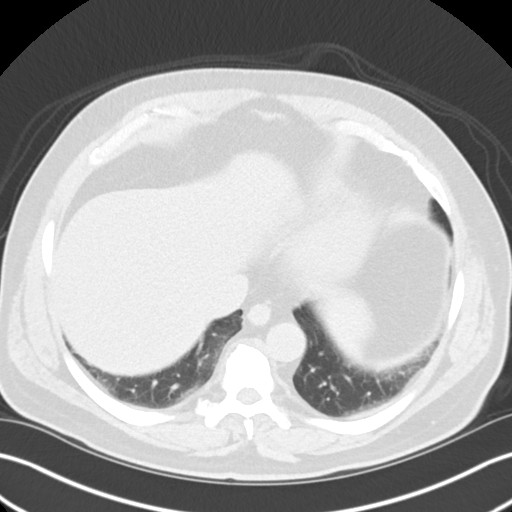

[Series 5: cor without without pre 2.00 cor · coronal · non-contrast · 0.74mm/px · 2 of 168 slices shown, 3 images]
[im 56/168  soft-tissue]
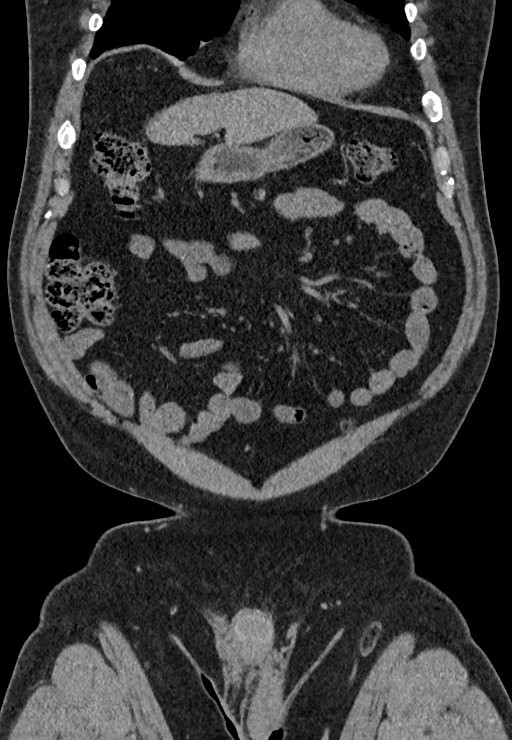
[im 56/168  bone]
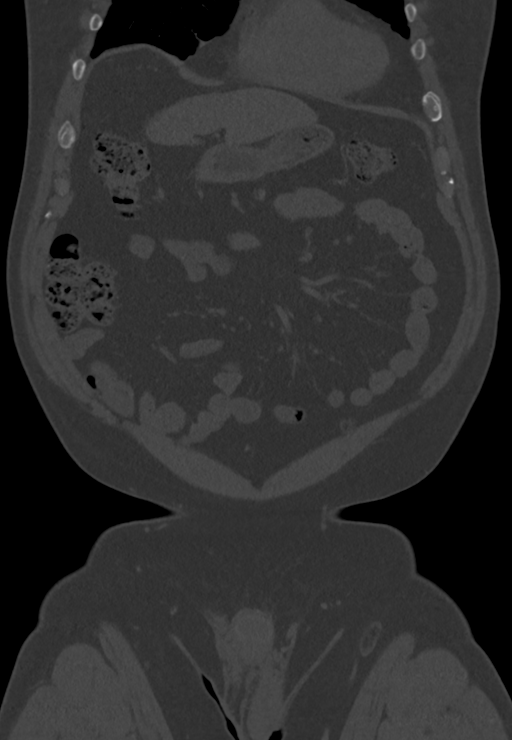
[im 112/168  soft-tissue]
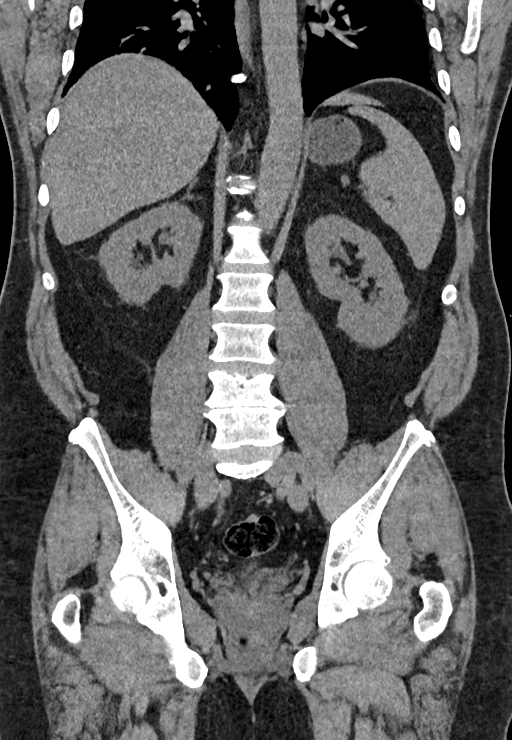

[Series 9: axial with hematuria with 5.00 · axial · 0.74mm/px · z∈[-1505,-1425]mm · 2 of 110 slices shown]
[im 16/110  soft-tissue]
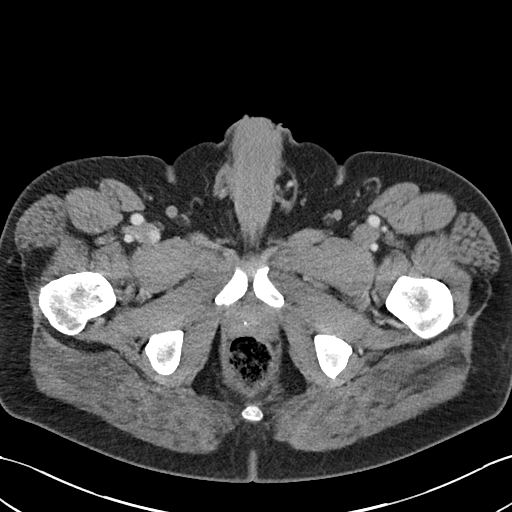
[im 32/110  soft-tissue]
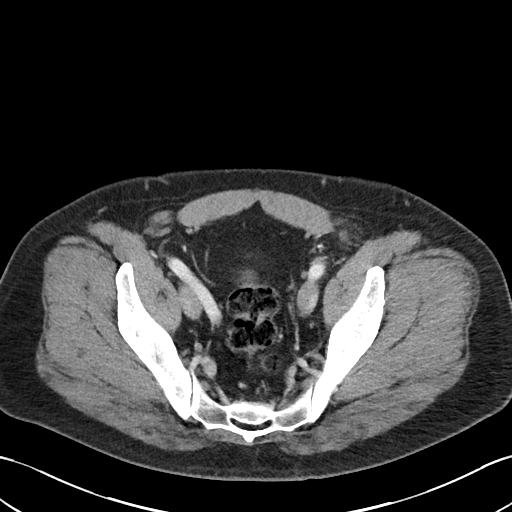

[11 of 46 positions shown; findings below may reference images not displayed]

RADIATION DOSE REDUCTION: This exam was performed according to the
departmental dose-optimization program which includes automated
exposure control, adjustment of the mA and/or kV according to
patient size and/or use of iterative reconstruction technique.

CONTRAST:  100mL OMNIPAQUE IOHEXOL 350 MG/ML SOLN
FINDINGS: Lower chest: Subsegmental atelectatic changes at the lung bases.

Hepatobiliary: No focal liver abnormality is seen. No gallstones,
gallbladder wall thickening, or biliary dilatation.

Pancreas: Unremarkable. No pancreatic ductal dilatation or
surrounding inflammatory changes.

Spleen: Normal in size without focal abnormality.

Adrenals/Urinary Tract: Adrenal glands appear normal. 3 mm calculus
at the lower pole left kidney. No hydronephrosis identified
bilaterally. 6 mm hypodense cyst in the anterior left kidney. No
enhancing renal mass or suspicious collecting system filling defects
identified. Ureters are normal caliber. Urinary bladder is within
normal limits.

Stomach/Bowel: No bowel obstruction, free air or pneumatosis. No
bowel wall edema identified. Moderate amount of retained fecal
material throughout the colon. Appendix is normal.

Vascular/Lymphatic: No significant vascular findings are present. No
enlarged abdominal or pelvic lymph nodes.

Reproductive: Prostate gland normal size.

Other: No ascites.

Musculoskeletal: Degenerative changes of the lumbar spine. No
suspicious bony lesions identified.
IMPRESSION: 1. Left renal calculus.  No hydronephrosis.
2. No suspicious mass or lymphadenopathy.

## 2021-12-18 MED ORDER — IOHEXOL 350 MG/ML SOLN
100.0000 mL | Freq: Once | INTRAVENOUS | Status: AC | PRN
  Administered 2021-12-18: 100 mL via INTRAVENOUS

## 2021-12-23 ENCOUNTER — Encounter: Payer: Self-pay | Admitting: Family Medicine

## 2021-12-23 ENCOUNTER — Ambulatory Visit (INDEPENDENT_AMBULATORY_CARE_PROVIDER_SITE_OTHER): Payer: No Typology Code available for payment source | Admitting: Family Medicine

## 2021-12-23 ENCOUNTER — Other Ambulatory Visit: Payer: Self-pay

## 2021-12-23 VITALS — BP 161/90 | HR 55 | Ht 68.0 in | Wt 224.4 lb

## 2021-12-23 DIAGNOSIS — F339 Major depressive disorder, recurrent, unspecified: Secondary | ICD-10-CM

## 2021-12-23 DIAGNOSIS — Z125 Encounter for screening for malignant neoplasm of prostate: Secondary | ICD-10-CM | POA: Diagnosis not present

## 2021-12-23 DIAGNOSIS — Z0000 Encounter for general adult medical examination without abnormal findings: Secondary | ICD-10-CM | POA: Diagnosis not present

## 2021-12-23 DIAGNOSIS — E039 Hypothyroidism, unspecified: Secondary | ICD-10-CM

## 2021-12-23 DIAGNOSIS — R739 Hyperglycemia, unspecified: Secondary | ICD-10-CM | POA: Diagnosis not present

## 2021-12-23 DIAGNOSIS — I10 Essential (primary) hypertension: Secondary | ICD-10-CM

## 2021-12-23 NOTE — Patient Instructions (Signed)

## 2021-12-23 NOTE — Progress Notes (Signed)
I,Sha'taria Tyson,acting as a Neurosurgeon for [name], MD.,have documented all relevant documentation on the behalf of [name], MD,as directed by  [name], MD while in the presence of [name], MD.  Complete physical exam   Patient: Reginald Simpson   DOB: 1956/09/10   67 y.o. Male  MRN: [id number] Visit Date: 12/23/2021  Today's healthcare provider: [name], MD   No chief complaint on file.  Subjective    Reginald Simpson is a 66 y.o. male who presents today for a complete physical exam.  He reports consuming a general and low carb  diet. The patient does not participate in regular exercise at present. He generally feels well. He reports sleeping well. He does have additional problems to discuss today.  HPI  Patient reports having blood in urine being worked up by urologist, reports follow up scheduled later this week.   Past Medical History:  Diagnosis Date   Colon polyps    Hyperglycemia    Past Surgical History:  Procedure Laterality Date   BACK SURGERY  1994   Ruptured lumbar disk   COLONOSCOPY  07/07/2013   Mild active colitis of Cecum, Hyperplastic polyp in rectum. [facility]   SHOULDER ARTHROSCOPY Right 03/2017   Dr. Thomasena Edis,, [facility]   SHOULDER ARTHROSCOPY Left 03/2019   Social History   Socioeconomic History   Marital status: Married    Spouse name: Not on file   Number of children: 3   Years of education: Not on file   Highest education level: Not on file  Occupational History   Occupation: Self employed  Tobacco Use   Smoking status: Never   Smokeless tobacco: Never  Vaping Use   Vaping Use: Never used  Substance and Sexual Activity   Alcohol use: Yes    Alcohol/week: 0.0 standard drinks    Comment: 2-3 beers or glasses of wine 3 night each week   Drug use: No   Sexual activity: Not on file  Other Topics Concern   Not on file  Social History Narrative   Not on file   Social Determinants of  Health   Financial Resource Strain: Not on file  Food Insecurity: Not on file  Transportation Needs: Not on file  Physical Activity: Not on file  Stress: Not on file  Social Connections: Not on file  Intimate Partner Violence: Not on file   Family Status  Relation Name Status   Mother  Alive   Father  Deceased at age 44       Cause of Death: Prostate cancer   Sister  Alive   Brother  Alive   Brother  Alive   Family History  Problem Relation Age of Onset   Hypertension Mother    Prostate cancer Father 67   No Known Allergies  Patient Care Team: Malva Limes, MD as PCP - General (Family Medicine) Riki Altes, MD (Urology)   Medications: Outpatient Medications Prior to Visit  Medication Sig   amLODipine (NORVASC) 5 MG tablet Take 1 tablet (5 mg total) by mouth daily.   aspirin 81 MG tablet Take 81 mg by mouth daily.   escitalopram (LEXAPRO) 10 MG tablet Take 1 tablet (10 mg total) by mouth daily.   levothyroxine (SYNTHROID) 137 MCG tablet TAKE 1 TABLET BY MOUTH EVERY DAY   No facility-administered medications prior to visit.    Review of Systems  Genitourinary:  Positive for hematuria.  All other systems reviewed and  are negative.    Objective    BP (!) 161/90 (BP Location: Left Arm, Patient Position: Sitting, Cuff Size: Normal)    Pulse (!) 55    Ht 5\' 8"  (1.727 m)    Wt 224 lb 6.4 oz (101.8 kg)    SpO2 97%    BMI 34.12 kg/m    Physical Exam    General Appearance:    Obese male. Alert, cooperative, in no acute distress, appears stated age  Head:    Normocephalic, without obvious abnormality, atraumatic  Eyes:    PERRL, conjunctiva/corneas clear, EOM's intact, fundi    benign, both eyes       Ears:    Normal TM's and external ear canals, both ears  Neck:   Supple, symmetrical, trachea midline, no adenopathy;       thyroid:  No enlargement/tenderness/nodules; no carotid   bruit or JVD  Back:     Symmetric, no curvature, ROM normal, no CVA tenderness   Lungs:     Clear to auscultation bilaterally, respirations unlabored  Chest wall:    No tenderness or deformity  Heart:    Bradycardic. Normal rhythm. No murmurs, rubs, or gallops.  S1 and S2 normal  Abdomen:     Soft, non-tender, bowel sounds active all four quadrants,    no masses, no organomegaly  Genitalia:    deferred  Rectal:    deferred  Extremities:   All extremities are intact. No cyanosis or edema  Pulses:   2+ and symmetric all extremities  Skin:   Skin color, texture, turgor normal, no rashes or lesions  Lymph nodes:   Cervical, supraclavicular, and axillary nodes normal  Neurologic:   CNII-XII intact. Normal strength, sensation and reflexes      throughout     Last depression screening scores PHQ 2/9 Scores 09/10/2020 05/30/2020 04/20/2020  PHQ - 2 Score 2 2 6   PHQ- 9 Score 5 7 18    Last fall risk screening Fall Risk  09/10/2020  Falls in the past year? 0  Number falls in past yr: 0  Injury with Fall? 0  Follow up Falls evaluation completed   Last Audit-C alcohol use screening Alcohol Use Disorder Test (AUDIT) 09/10/2020  1. How often do you have a drink containing alcohol? 3  2. How many drinks containing alcohol do you have on a typical day when you are drinking? 1  3. How often do you have six or more drinks on one occasion? 0  AUDIT-C Score 4  4. How often during the last year have you found that you were not able to stop drinking once you had started? 0  5. How often during the last year have you failed to do what was normally expected from you because of drinking? 0  6. How often during the last year have you needed a first drink in the morning to get yourself going after a heavy drinking session? 0  7. How often during the last year have you had a feeling of guilt of remorse after drinking? 1  8. How often during the last year have you been unable to remember what happened the night before because you had been drinking? 0  9. Have you or someone else been  injured as a result of your drinking? 0  10. Has a relative or friend or a doctor or another health worker been concerned about your drinking or suggested you cut down? 0  Alcohol Use Disorder Identification Test Final Score (AUDIT)  5  Alcohol Brief Interventions/Follow-up AUDIT Score <7 follow-up not indicated   A score of 3 or more in women, and 4 or more in men indicates increased risk for alcohol abuse, EXCEPT if all of the points are from question 1   No results found for any visits on 12/23/21.  Assessment & Plan    Routine Health Maintenance and Physical Exam  Exercise Activities and Dietary recommendations  Goals   None     Immunization History  Administered Date(s) Administered   Janssen (J&J) SARS-COV-2 Vaccination 03/18/2020   Tdap 01/05/2008, 07/11/2019    Health Maintenance  Topic Date Due   HIV Screening  Never done   Zoster Vaccines- Shingrix (1 of 2) Never done   COVID-19 Vaccine (2 - Booster for [employer] series) 05/13/2020   INFLUENZA VACCINE  Never done   Fecal DNA (Cologuard)  02/22/2023   TETANUS/TDAP  07/10/2029   Hepatitis C Screening  Completed   HPV VACCINES  Aged Out   COLONOSCOPY (Pts 45-71yrs Insurance coverage will need to be confirmed)  Discontinued    Discussed health benefits of physical activity, and encouraged him to engage in regular exercise appropriate for his age and condition.  1. Annual physical exam  - Lipid panel - Comprehensive metabolic panel - CBC  2. Prostate cancer screening  - PSA Total (Reflex To Free) ([facility] only)  3. Hypothyroidism, unspecified type  - TSH  4. Hyperglycemia  - HgB A1c  5. Depression, recurrent ([employer]) Doing well on escitalopram which he wishes to continue unchanged.   6. Primary hypertension Uncontrolled due to poor dietary habits and weight gain. Discussed increasing dose of amlodipine, however he anticipates improving diet and exercise, and losing weight over the next few months. Will  follow up in 3-4 months.  - EKG 12-Lead      The entirety of the information documented in the History of Present Illness, Review of Systems and Physical Exam were personally obtained by me. Portions of this information were initially documented by the CMA and reviewed by me for thoroughness and accuracy.     Lelon Huh, MD  [facility] [phone] (phone) [phone] (fax)  [place]

## 2021-12-24 LAB — COMPREHENSIVE METABOLIC PANEL
ALT: 43 IU/L (ref 0–44)
AST: 26 IU/L (ref 0–40)
Albumin/Globulin Ratio: 1.8 (ref 1.2–2.2)
Albumin: 4.4 g/dL (ref 3.8–4.8)
Alkaline Phosphatase: 93 IU/L (ref 44–121)
BUN/Creatinine Ratio: 15 (ref 10–24)
BUN: 14 mg/dL (ref 8–27)
Bilirubin Total: 0.6 mg/dL (ref 0.0–1.2)
CO2: 27 mmol/L (ref 20–29)
Calcium: 9.3 mg/dL (ref 8.6–10.2)
Chloride: 99 mmol/L (ref 96–106)
Creatinine, Ser: 0.91 mg/dL (ref 0.76–1.27)
Globulin, Total: 2.4 g/dL (ref 1.5–4.5)
Glucose: 103 mg/dL — ABNORMAL HIGH (ref 70–99)
Potassium: 5 mmol/L (ref 3.5–5.2)
Sodium: 138 mmol/L (ref 134–144)
Total Protein: 6.8 g/dL (ref 6.0–8.5)
eGFR: 94 mL/min/{1.73_m2} (ref >59)

## 2021-12-24 LAB — CBC
Hematocrit: 47.5 % (ref 37.5–51.0)
Hemoglobin: 16.5 g/dL (ref 13.0–17.7)
MCH: 31.9 pg (ref 26.6–33.0)
MCHC: 34.7 g/dL (ref 31.5–35.7)
MCV: 92 fL (ref 79–97)
Platelets: 236 10*3/uL (ref 150–450)
RBC: 5.18 x10E6/uL (ref 4.14–5.80)
RDW: 12.5 % (ref 11.6–15.4)
WBC: 5 10*3/uL (ref 3.4–10.8)

## 2021-12-24 LAB — LIPID PANEL
Chol/HDL Ratio: 3.8 ratio (ref 0.0–5.0)
Cholesterol, Total: 215 mg/dL — ABNORMAL HIGH (ref 100–199)
HDL: 57 mg/dL (ref >39)
LDL Chol Calc (NIH): 134 mg/dL — ABNORMAL HIGH (ref 0–99)
Triglycerides: 137 mg/dL (ref 0–149)
VLDL Cholesterol Cal: 24 mg/dL (ref 5–40)

## 2021-12-24 LAB — PSA TOTAL (REFLEX TO FREE): Prostate Specific Ag, Serum: 0.9 ng/mL (ref 0.0–4.0)

## 2021-12-24 LAB — HEMOGLOBIN A1C
Est. average glucose Bld gHb Est-mCnc: 123 mg/dL
Hgb A1c MFr Bld: 5.9 % — ABNORMAL HIGH (ref 4.8–5.6)

## 2021-12-24 LAB — TSH: TSH: 3.08 u[IU]/mL (ref 0.450–4.500)

## 2021-12-27 ENCOUNTER — Other Ambulatory Visit: Payer: Self-pay

## 2021-12-27 ENCOUNTER — Ambulatory Visit (INDEPENDENT_AMBULATORY_CARE_PROVIDER_SITE_OTHER): Payer: No Typology Code available for payment source | Admitting: Urology

## 2021-12-27 ENCOUNTER — Encounter: Payer: Self-pay | Admitting: Urology

## 2021-12-27 VITALS — BP 170/79 | HR 54 | Ht 68.0 in | Wt 212.0 lb

## 2021-12-27 DIAGNOSIS — R310 Gross hematuria: Secondary | ICD-10-CM

## 2021-12-27 DIAGNOSIS — N200 Calculus of kidney: Secondary | ICD-10-CM

## 2021-12-27 NOTE — Progress Notes (Signed)
° °  12/27/21  CC:  Chief Complaint  Patient presents with   Cysto    HPI: Episode total gross painless hematuria late December 2022.  No recurrent episodes.  CTU 12/18/2021 with 3 mm nonobstructing left lower pole calculus; 6 mm left renal cyst  Blood pressure (!) 170/79, pulse (!) 54, height 5\' 8"  (1.727 m), weight 212 lb (96.2 kg). NED. A&Ox3.   No respiratory distress   Abd soft, NT, ND Normal phallus with bilateral descended testicles  Cystoscopy Procedure Note  Patient identification was confirmed, informed consent was obtained, and patient was prepped using Betadine solution.  Lidocaine jelly was administered per urethral meatus.     Pre-Procedure: - Inspection reveals a normal caliber urethral meatus.  Procedure: The flexible cystoscope was introduced without difficulty - No urethral strictures/lesions are present. -  Moderate lateral lobe enlargement  prostate with prominent hypervascularity -Mild elevation bladder neck - Bilateral ureteral orifices identified - Bladder mucosa  reveals no ulcers, tumors, or lesions - No bladder stones - No trabeculation  Retroflexion shows no intravesical median lobe or lesions   Post-Procedure: - Patient tolerated the procedure well  Assessment/ Plan: Small nonobstructing left renal calculus CTU No bladder mucosal abnormalities on lower tract evaluation Prominent hypervascularity prostate which is the most likely source of his hematuria Urine cytology sent Call for recurrent gross hematuria 1 year follow-up with [facility]    Abbie Sons, MD

## 2021-12-30 LAB — CYTOLOGY - NON PAP

## 2021-12-31 ENCOUNTER — Encounter: Payer: Self-pay | Admitting: *Deleted

## 2022-01-25 ENCOUNTER — Other Ambulatory Visit: Payer: Self-pay | Admitting: Family Medicine

## 2022-01-25 DIAGNOSIS — F439 Reaction to severe stress, unspecified: Secondary | ICD-10-CM

## 2022-01-25 DIAGNOSIS — F339 Major depressive disorder, recurrent, unspecified: Secondary | ICD-10-CM

## 2022-03-08 ENCOUNTER — Other Ambulatory Visit: Payer: Self-pay | Admitting: Family Medicine

## 2022-03-08 DIAGNOSIS — E039 Hypothyroidism, unspecified: Secondary | ICD-10-CM

## 2022-04-05 ENCOUNTER — Other Ambulatory Visit: Payer: Self-pay

## 2022-04-05 ENCOUNTER — Emergency Department
Admission: EM | Admit: 2022-04-05 | Discharge: 2022-04-05 | Disposition: A | Discharge status: Home / Self Care | Payer: No Typology Code available for payment source | Attending: Emergency Medicine | Admitting: Emergency Medicine

## 2022-04-05 ENCOUNTER — Encounter: Payer: Self-pay | Admitting: Emergency Medicine

## 2022-04-05 ENCOUNTER — Emergency Department: Payer: No Typology Code available for payment source

## 2022-04-05 DIAGNOSIS — H538 Other visual disturbances: Secondary | ICD-10-CM | POA: Diagnosis not present

## 2022-04-05 DIAGNOSIS — I639 Cerebral infarction, unspecified: Secondary | ICD-10-CM | POA: Insufficient documentation

## 2022-04-05 DIAGNOSIS — Z20822 Contact with and (suspected) exposure to covid-19: Secondary | ICD-10-CM | POA: Diagnosis not present

## 2022-04-05 DIAGNOSIS — R519 Headache, unspecified: Secondary | ICD-10-CM | POA: Diagnosis present

## 2022-04-05 DIAGNOSIS — Z7982 Long term (current) use of aspirin: Secondary | ICD-10-CM | POA: Diagnosis not present

## 2022-04-05 DIAGNOSIS — I10 Essential (primary) hypertension: Secondary | ICD-10-CM

## 2022-04-05 LAB — COMPREHENSIVE METABOLIC PANEL
ALT: 32 U/L (ref 0–44)
AST: 31 U/L (ref 15–41)
Albumin: 4.3 g/dL (ref 3.5–5.0)
Alkaline Phosphatase: 71 U/L (ref 38–126)
Anion gap: 7 (ref 5–15)
BUN: 18 mg/dL (ref 8–23)
CO2: 28 mmol/L (ref 22–32)
Calcium: 8.9 mg/dL (ref 8.9–10.3)
Chloride: 103 mmol/L (ref 98–111)
Creatinine, Ser: 0.83 mg/dL (ref 0.61–1.24)
GFR, Estimated: >60 mL/min (ref >60)
Glucose, Bld: 109 mg/dL — ABNORMAL HIGH (ref 70–99)
Potassium: 4 mmol/L (ref 3.5–5.1)
Sodium: 138 mmol/L (ref 135–145)
Total Bilirubin: 1 mg/dL (ref 0.3–1.2)
Total Protein: 7.8 g/dL (ref 6.5–8.1)

## 2022-04-05 LAB — DIFFERENTIAL
Abs Immature Granulocytes: 0.01 10*3/uL (ref 0.00–0.07)
Basophils Absolute: 0 10*3/uL (ref 0.0–0.1)
Basophils Relative: 0 %
Eosinophils Absolute: 0 10*3/uL (ref 0.0–0.5)
Eosinophils Relative: 1 %
Immature Granulocytes: 0 %
Lymphocytes Relative: 26 %
Lymphs Abs: 1.7 10*3/uL (ref 0.7–4.0)
Monocytes Absolute: 0.6 10*3/uL (ref 0.1–1.0)
Monocytes Relative: 10 %
Neutro Abs: 4 10*3/uL (ref 1.7–7.7)
Neutrophils Relative %: 63 %

## 2022-04-05 LAB — APTT: aPTT: 34 seconds (ref 24–36)

## 2022-04-05 LAB — CBC
HCT: 51.5 % (ref 39.0–52.0)
Hemoglobin: 17.3 g/dL — ABNORMAL HIGH (ref 13.0–17.0)
MCH: 30.9 pg (ref 26.0–34.0)
MCHC: 33.6 g/dL (ref 30.0–36.0)
MCV: 92.1 fL (ref 80.0–100.0)
Platelets: 253 10*3/uL (ref 150–400)
RBC: 5.59 MIL/uL (ref 4.22–5.81)
RDW: 12.6 % (ref 11.5–15.5)
WBC: 6.4 10*3/uL (ref 4.0–10.5)
nRBC: 0 % (ref 0.0–0.2)

## 2022-04-05 LAB — PROTIME-INR
INR: 1 (ref 0.8–1.2)
Prothrombin Time: 13.2 seconds (ref 11.4–15.2)

## 2022-04-05 IMAGING — MR MR MRA NECK WO/W CM
4 of 5 series · 36 of 48 positions shown · IV contrast (9ml Gadavist)
Comparison: None Available.

CLINICAL DATA: Transient ischemic attack (TIA); Stroke/TIA,
determine embolic source

EXAM:
MRI HEAD WITHOUT CONTRAST
MRA HEAD WITHOUT CONTRAST
MRA NECK WITHOUT AND WITH CONTRAST
TECHNIQUE: Multiplanar, multi-echo pulse sequences of the brain and surrounding
structures were acquired without intravenous contrast. Angiographic
images of the Circle of Willis were acquired using MRA technique
without intravenous contrast. Angiographic images of the neck were
acquired using MRA technique without and with intravenous contrast.
Carotid stenosis measurements (when applicable) are obtained
utilizing NASCET criteria, using the distal internal carotid
diameter as the denominator.
CONTRAST:  9mL GADAVIST GADOBUTROL 1 MMOL/ML IV SOLN

[Series 9: angio_fl3d_cor_pre_ttc=2.0s · coronal · 0.9mm · 0.85mm/px · 9 of 96 slices shown]
[im 1/96]
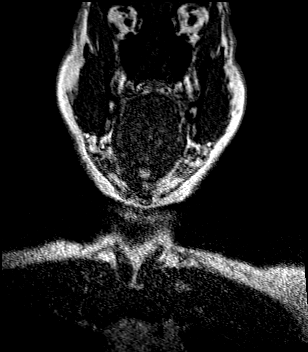
[im 12/96]
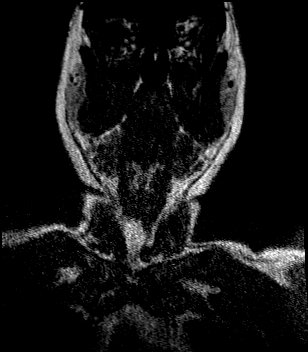
[im 24/96]
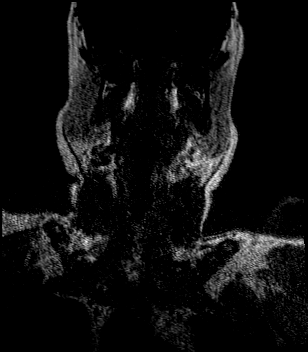
[im 36/96]
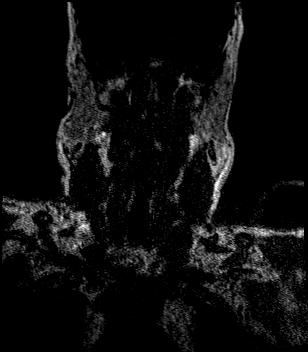
[im 48/96]
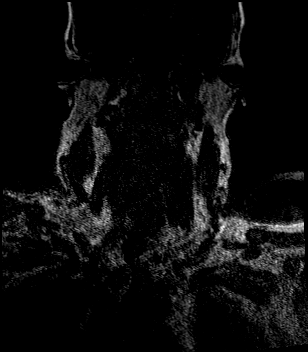
[im 60/96]
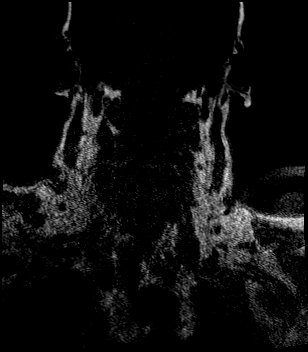
[im 72/96]
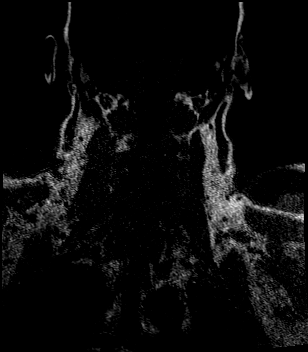
[im 84/96]
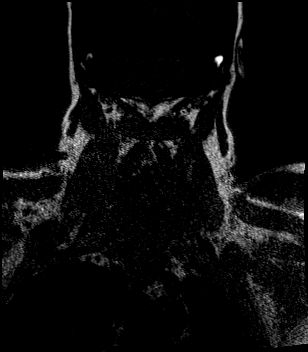
[im 96/96]
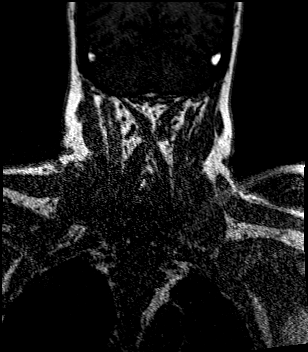

[Series 11: angio_fl3d_cor_post_ttc=2.0s · coronal · 0.9mm · 0.85mm/px · 9 of 96 slices shown]
[im 1/96]
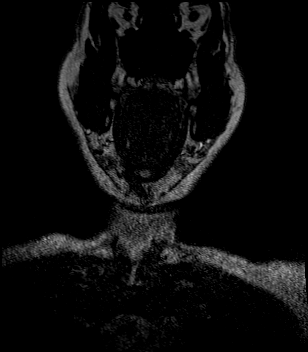
[im 12/96]
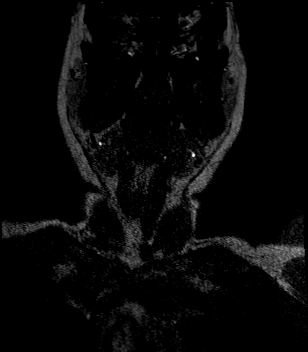
[im 24/96]
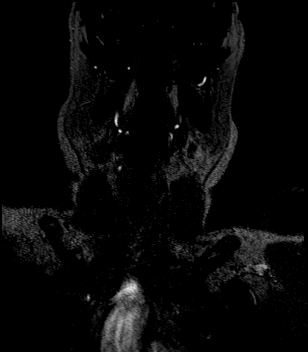
[im 36/96]
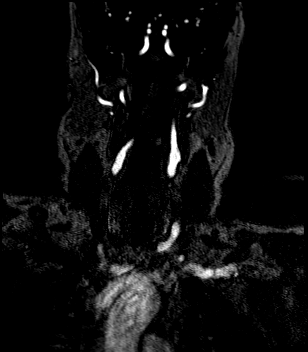
[im 48/96]
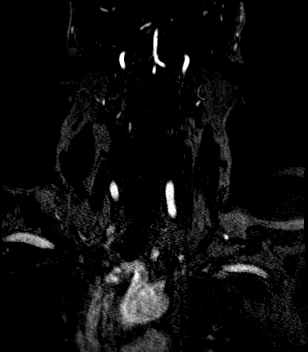
[im 60/96]
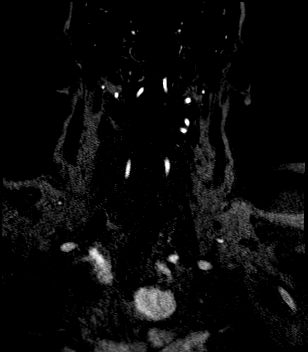
[im 72/96]
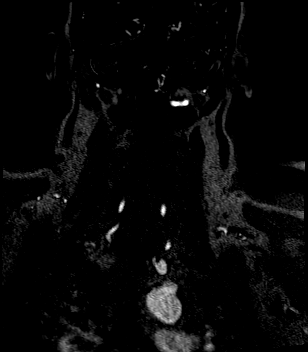
[im 84/96]
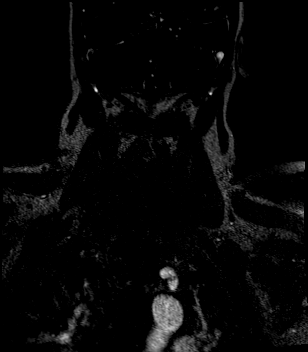
[im 96/96]
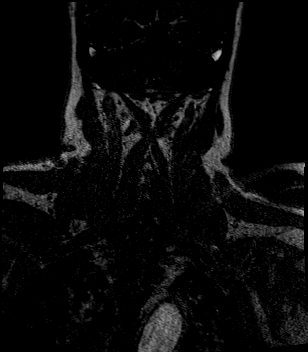

[Series 12: angio_fl3d_cor_post_ttc=2.0s_moco-adv · coronal · 0.9mm · 0.85mm/px · 9 of 96 slices shown]
[im 1/96]
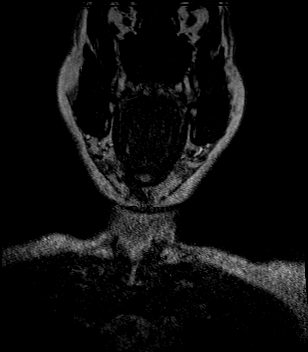
[im 12/96]
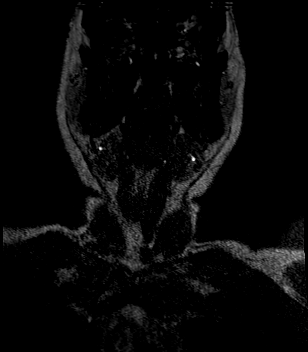
[im 24/96]
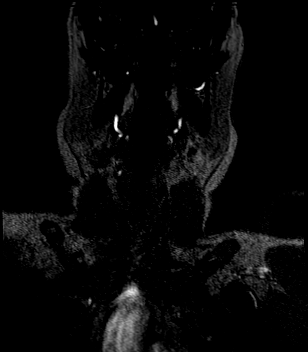
[im 36/96]
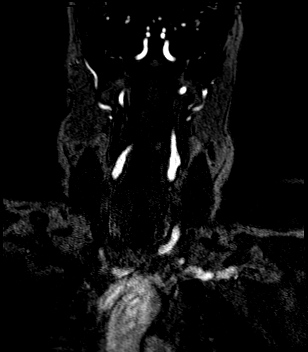
[im 48/96]
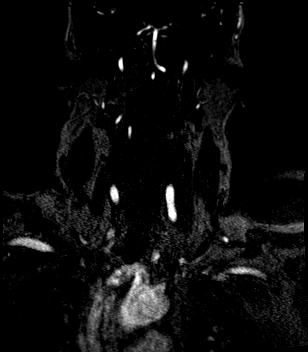
[im 60/96]
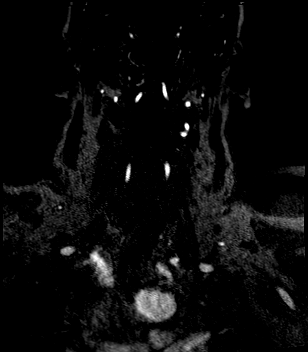
[im 72/96]
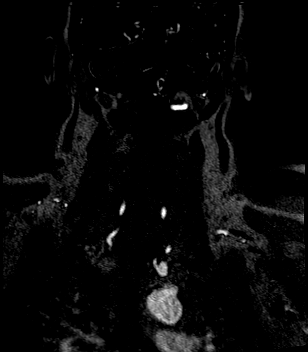
[im 84/96]
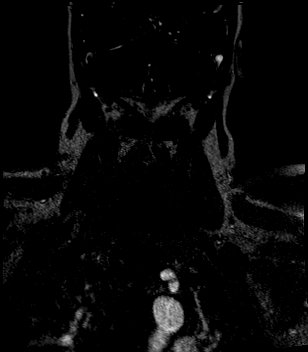
[im 96/96]
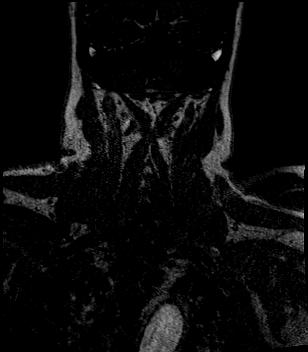

[Series 13: angio_fl3d_cor_post_ttc=2.0s_moco-adv_sub · coronal · 0.9mm · 0.85mm/px · 9 of 95 slices shown]
[im 1/95]
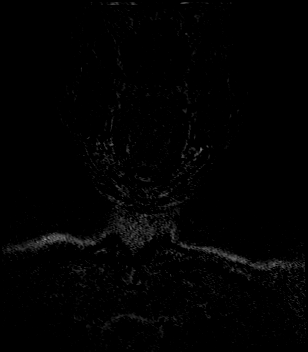
[im 12/95]
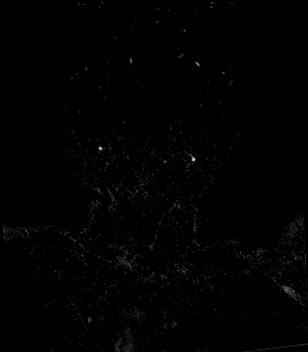
[im 24/95]
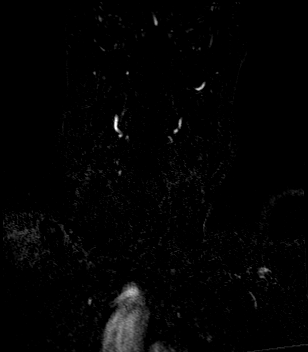
[im 36/95]
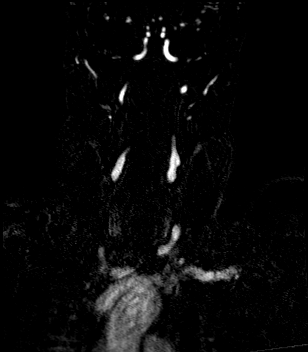
[im 48/95]
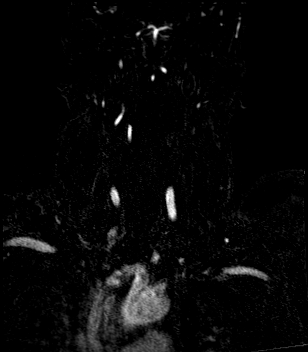
[im 59/95]
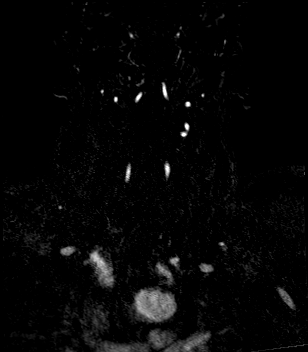
[im 71/95]
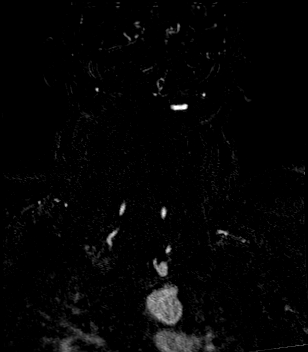
[im 83/95]
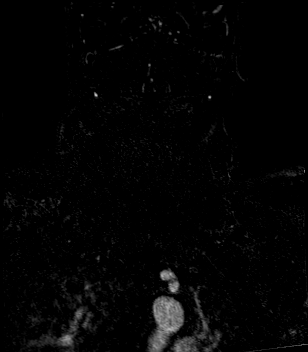
[im 95/95]
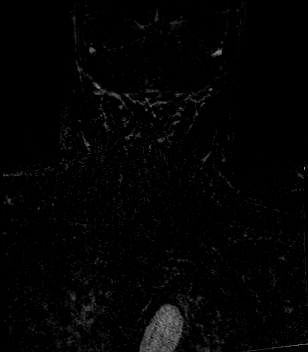

[36 of 48 positions shown; findings below may reference images not displayed]

FINDINGS: MRI HEAD

Brain: There is no acute infarction or intracranial hemorrhage.
There is no intracranial mass, mass effect, or edema. There is no
hydrocephalus or extra-axial fluid collection. Ventricles and sulci
are normal in size and configuration. Minimal punctate foci of T2
hyperintensity in the supratentorial white matter likely reflect
nonspecific gliosis/demyelination.

Vascular: Major vessel flow voids at the skull base are preserved.

Skull and upper cervical spine: Normal marrow signal is preserved.

Sinuses/Orbits: Paranasal sinuses are aerated. Orbits are
unremarkable.

Other: Sella is unremarkable.  Mastoid air cells are clear.

MRA HEAD

Intracranial internal carotid arteries are patent with
atherosclerotic irregularity. Middle and anterior cerebral arteries
are patent. Intracranial vertebral arteries, basilar artery,
posterior cerebral arteries are patent. There is a very short
segment asymmetric fenestration of the mid to distal basilar. There
is no significant stenosis or aneurysm.

MRA NECK

Aortic arch is unremarkable. Great vessel origins are patent. There
is no proximal subclavian artery stenosis. Common, internal, and
external carotid arteries are patent. Mild plaque at the ICA origins
without stenosis. Codominant extracranial vertebral arteries are
patent. There is apparent marked stenosis of the right vertebral
origin. No evidence of dissection.
IMPRESSION: No acute infarction, hemorrhage, mass, or other significant
abnormality.

Apparent marked stenosis at the right vertebral origin without
apparent flow limitation.

No hemodynamically significant stenosis of the carotids.

## 2022-04-05 IMAGING — MR MR HEAD W/O CM
13 series · 48 of 48 positions shown · IV contrast (gadavist)
Comparison: None Available.

CLINICAL DATA: Transient ischemic attack (TIA); Stroke/TIA,
determine embolic source

EXAM:
MRI HEAD WITHOUT CONTRAST
MRA HEAD WITHOUT CONTRAST
MRA NECK WITHOUT AND WITH CONTRAST
TECHNIQUE: Multiplanar, multi-echo pulse sequences of the brain and surrounding
structures were acquired without intravenous contrast. Angiographic
images of the Circle of Willis were acquired using MRA technique
without intravenous contrast. Angiographic images of the neck were
acquired using MRA technique without and with intravenous contrast.
Carotid stenosis measurements (when applicable) are obtained
utilizing NASCET criteria, using the distal internal carotid
diameter as the denominator.
CONTRAST:  9mL GADAVIST GADOBUTROL 1 MMOL/ML IV SOLN

[Series 5: ax dwi_tracew · axial · 3.0mm · 0.71mm/px · z∈[-97,+66]mm · 3 of 56 slices shown]
[im 1/56]
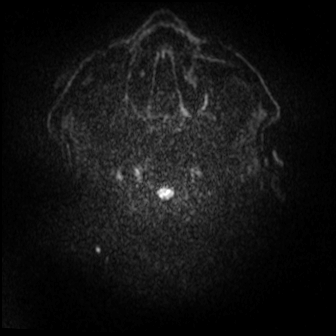
[im 28/56]
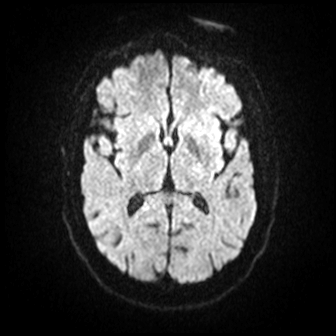
[im 56/56]
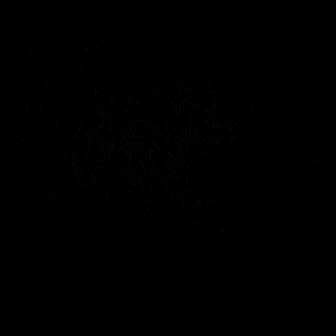

[Series 6: ax dwi_adc · axial · 3.0mm · 0.71mm/px · z∈[-97,+63]mm · 2 of 55 slices shown]
[im 1/55]
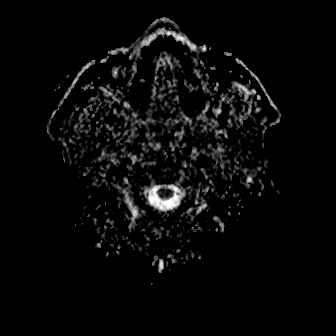
[im 55/55]
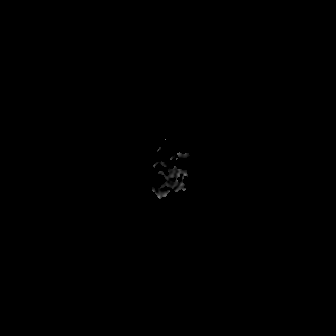

[Series 7: cor dwi_tracew · coronal · 5.0mm · 0.68mm/px · 5 of 80 slices shown]
[im 1/80]
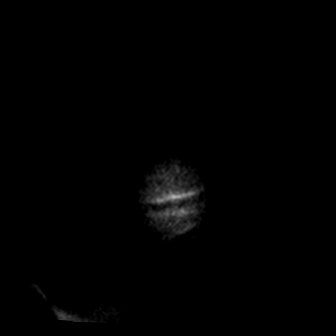
[im 20/80]
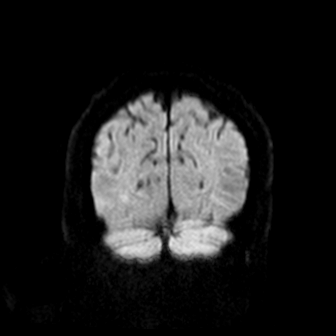
[im 40/80]
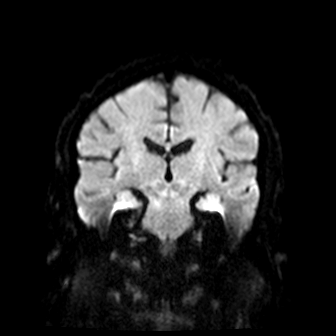
[im 60/80]
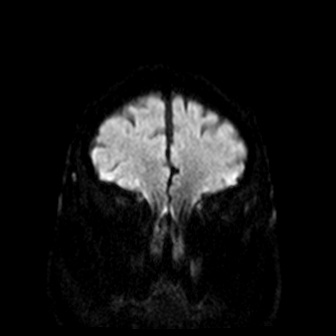
[im 80/80]
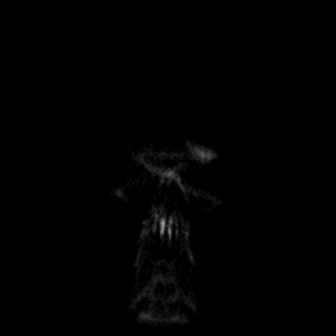

[Series 8: cor dwi_adc · coronal · 5.0mm · 0.68mm/px · 3 of 40 slices shown]
[im 1/40]
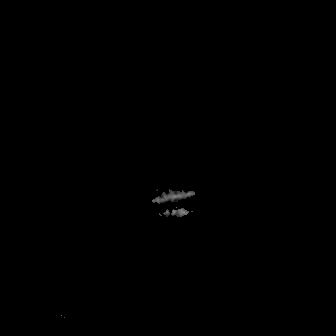
[im 20/40]
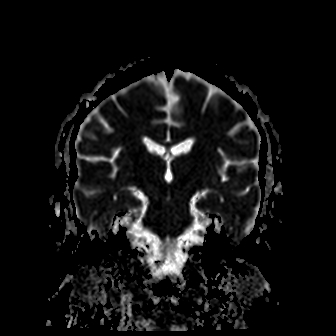
[im 40/40]
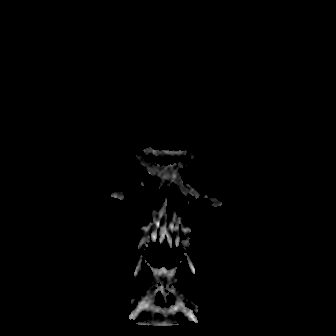

[Series 9: T1 · sagittal · 5.0mm · 0.47mm/px · 2 of 24 slices shown (1 of 2)]
[im 1/24]
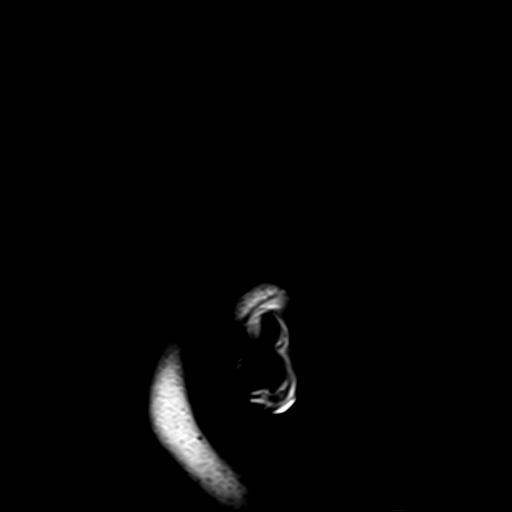
[im 24/24]
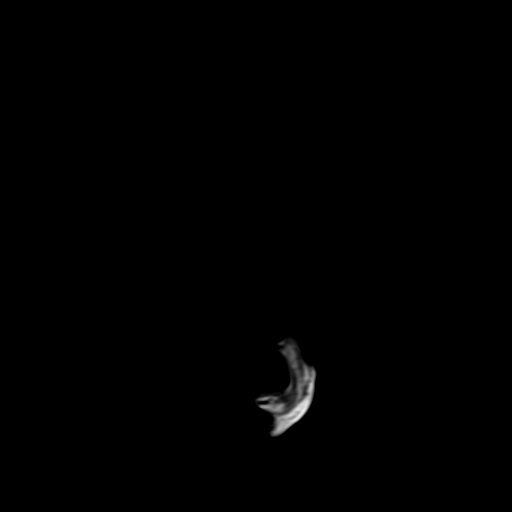

[Series 10: T2 · axial · 5.0mm · 0.86mm/px · z∈[-92,+62]mm · 2 of 27 slices shown (1 of 2)]
[im 1/27]
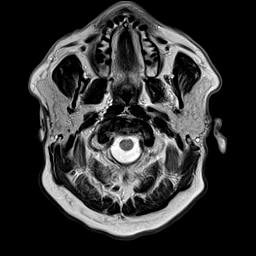
[im 27/27]
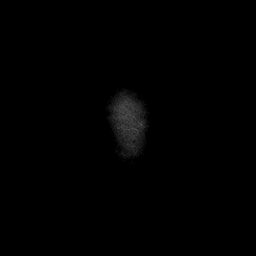

[Series 11: ax swi_mag · axial · 3.0mm · 0.90mm/px · z∈[-96,+67]mm · 4 of 56 slices shown]
[im 1/56]
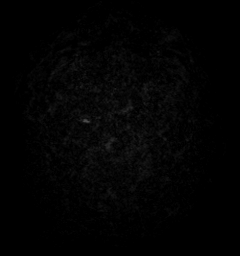
[im 19/56]
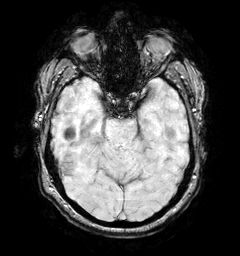
[im 37/56]
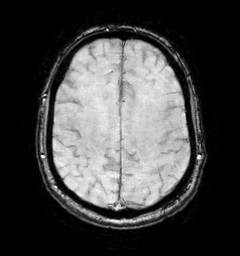
[im 56/56]
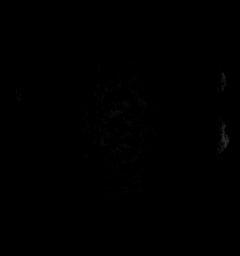

[Series 12: ax swi_pha · axial · 3.0mm · 0.90mm/px · z∈[-96,+67]mm · 4 of 56 slices shown]
[im 1/56]
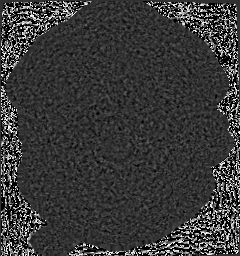
[im 19/56]
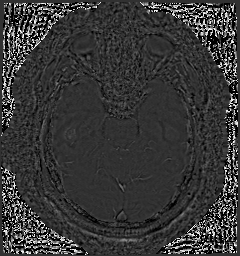
[im 37/56]
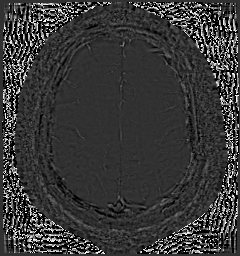
[im 56/56]
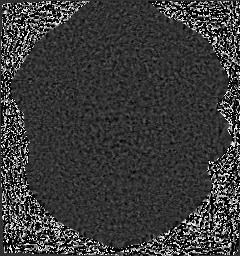

[Series 13: ax swi_swi · axial · 3.0mm · 0.90mm/px · z∈[-96,+67]mm · 4 of 56 slices shown]
[im 1/56]
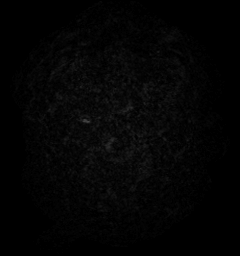
[im 19/56]
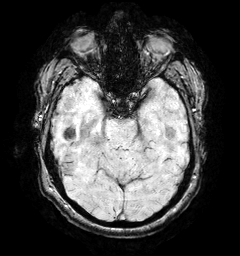
[im 37/56]
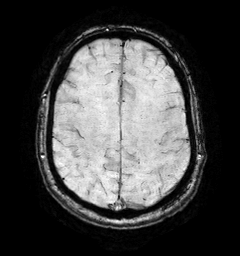
[im 56/56]
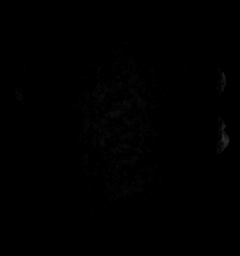

[Series 14: ax swi_swi_mip · axial · 24.0mm · 0.90mm/px · z∈[-85,+57]mm · 3 of 49 slices shown]
[im 1/49]
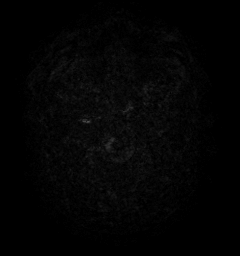
[im 25/49]
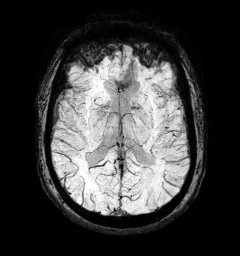
[im 49/49]
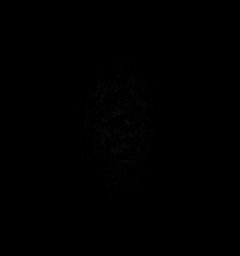

[Series 15: FLAIR · axial · 3.0mm · 0.69mm/px · z∈[-95,+65]mm · 3 of 55 slices shown]
[im 1/55]
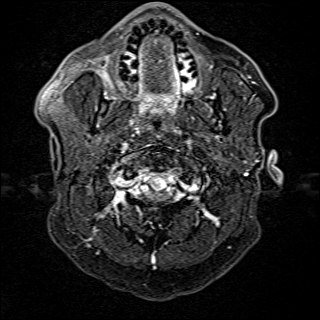
[im 28/55]
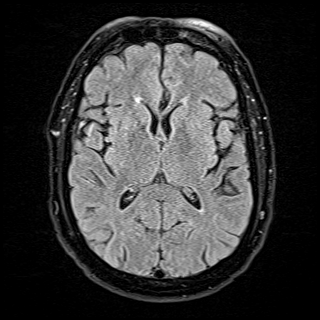
[im 55/55]
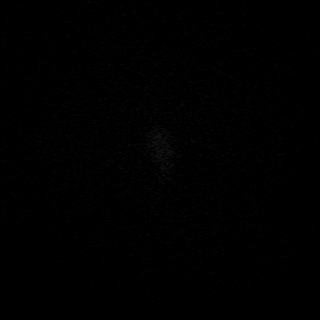

[Series 16: T1 · axial · 1.0mm · 0.98mm/px · z∈[-97,+75]mm · 11 of 176 slices shown (2 of 2)]
[im 1/176]
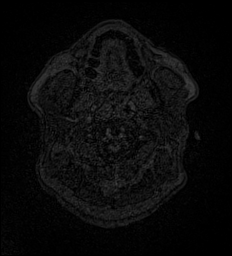
[im 18/176]
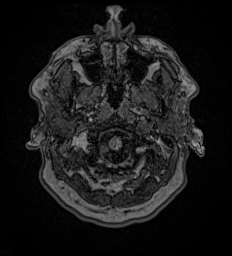
[im 36/176]
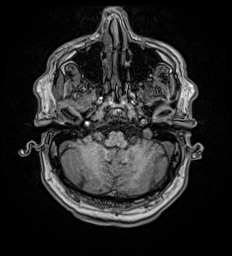
[im 53/176]
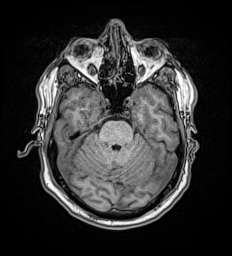
[im 71/176]
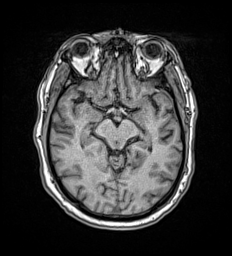
[im 88/176]
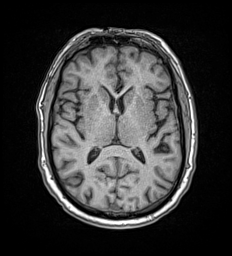
[im 106/176]
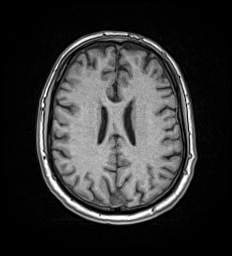
[im 123/176]
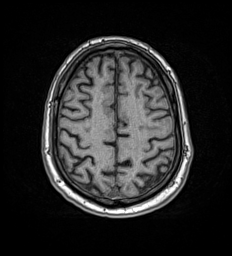
[im 141/176]
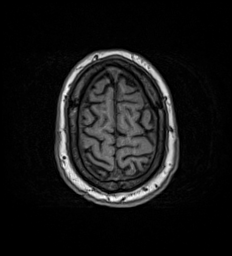
[im 158/176]
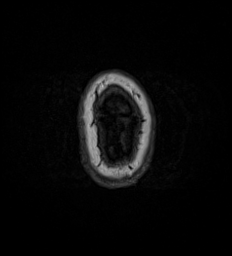
[im 176/176]
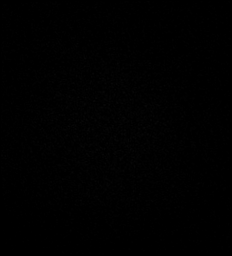

[Series 17: T2 · coronal · 5.0mm · 0.86mm/px · 2 of 30 slices shown (2 of 2)]
[im 1/30]
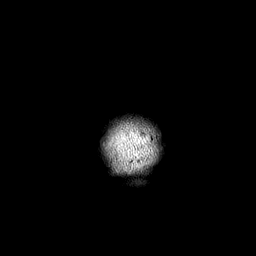
[im 30/30]
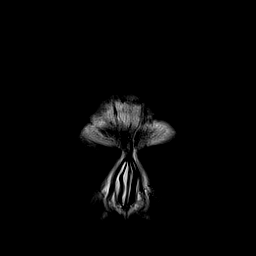

[48 of 48 positions shown; findings below may reference images not displayed]

FINDINGS: MRI HEAD

Brain: There is no acute infarction or intracranial hemorrhage.
There is no intracranial mass, mass effect, or edema. There is no
hydrocephalus or extra-axial fluid collection. Ventricles and sulci
are normal in size and configuration. Minimal punctate foci of T2
hyperintensity in the supratentorial white matter likely reflect
nonspecific gliosis/demyelination.

Vascular: Major vessel flow voids at the skull base are preserved.

Skull and upper cervical spine: Normal marrow signal is preserved.

Sinuses/Orbits: Paranasal sinuses are aerated. Orbits are
unremarkable.

Other: Sella is unremarkable.  Mastoid air cells are clear.

MRA HEAD

Intracranial internal carotid arteries are patent with
atherosclerotic irregularity. Middle and anterior cerebral arteries
are patent. Intracranial vertebral arteries, basilar artery,
posterior cerebral arteries are patent. There is a very short
segment asymmetric fenestration of the mid to distal basilar. There
is no significant stenosis or aneurysm.

MRA NECK

Aortic arch is unremarkable. Great vessel origins are patent. There
is no proximal subclavian artery stenosis. Common, internal, and
external carotid arteries are patent. Mild plaque at the ICA origins
without stenosis. Codominant extracranial vertebral arteries are
patent. There is apparent marked stenosis of the right vertebral
origin. No evidence of dissection.
IMPRESSION: No acute infarction, hemorrhage, mass, or other significant
abnormality.

Apparent marked stenosis at the right vertebral origin without
apparent flow limitation.

No hemodynamically significant stenosis of the carotids.

## 2022-04-05 IMAGING — MR MR MRA HEAD W/O CM
1 series · 26 of 48 positions shown · IV contrast (gadavist)
Comparison: None Available.

CLINICAL DATA: Transient ischemic attack (TIA); Stroke/TIA,
determine embolic source

EXAM:
MRI HEAD WITHOUT CONTRAST
MRA HEAD WITHOUT CONTRAST
MRA NECK WITHOUT AND WITH CONTRAST
TECHNIQUE: Multiplanar, multi-echo pulse sequences of the brain and surrounding
structures were acquired without intravenous contrast. Angiographic
images of the Circle of Willis were acquired using MRA technique
without intravenous contrast. Angiographic images of the neck were
acquired using MRA technique without and with intravenous contrast.
Carotid stenosis measurements (when applicable) are obtained
utilizing NASCET criteria, using the distal internal carotid
diameter as the denominator.
CONTRAST:  9mL GADAVIST GADOBUTROL 1 MMOL/ML IV SOLN

[Series 1: TOF · axial · 0.5mm · 0.48mm/px · z∈[-93,+3]mm · 26 of 217 slices shown]
[im 1/217]
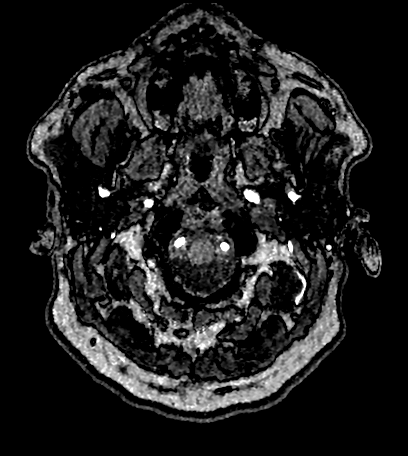
[im 5/217]
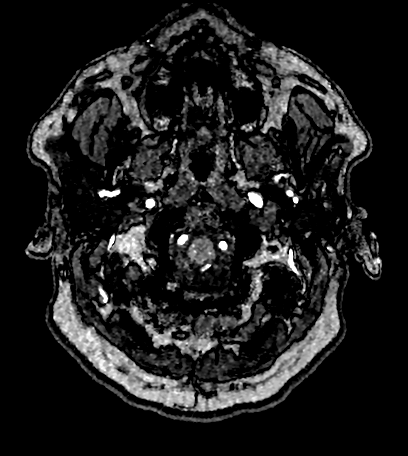
[im 10/217]
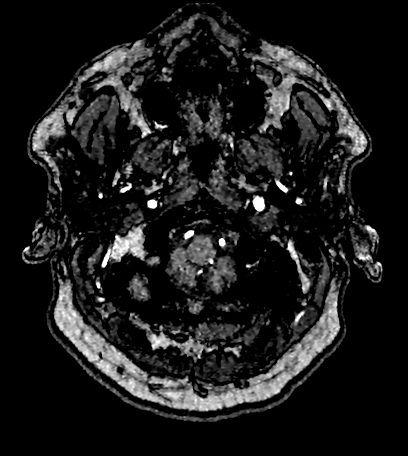
[im 14/217]
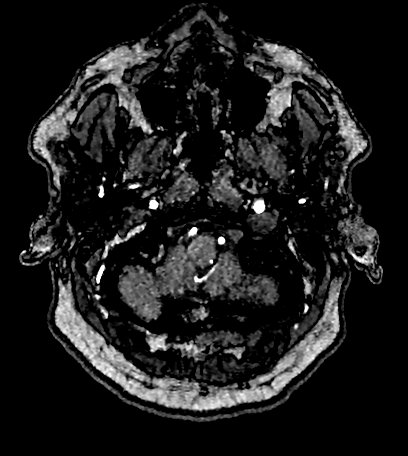
[im 19/217]
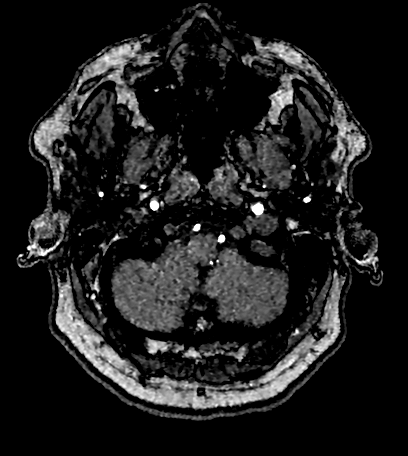
[im 23/217]
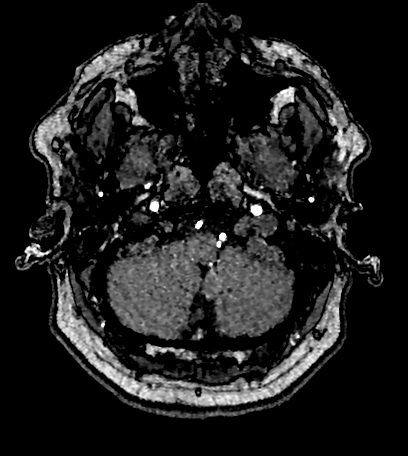
[im 28/217]
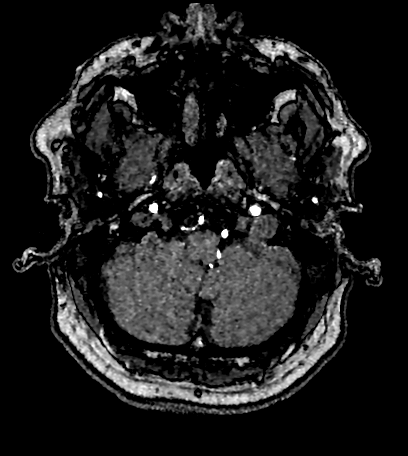
[im 33/217]
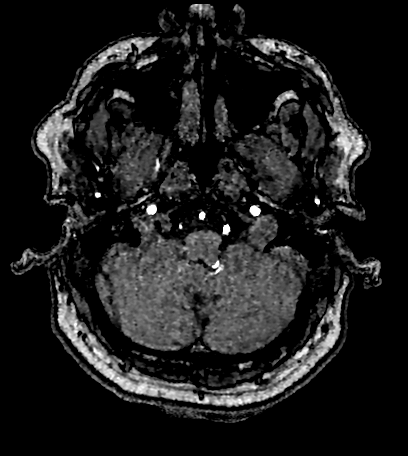
[im 37/217]
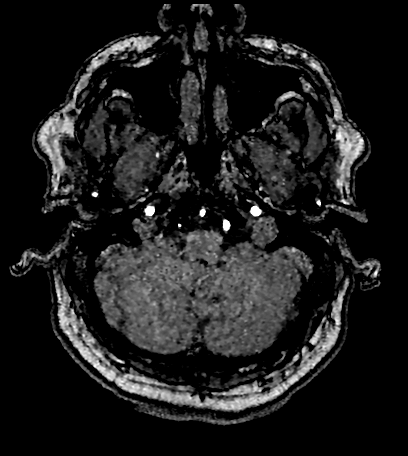
[im 42/217]
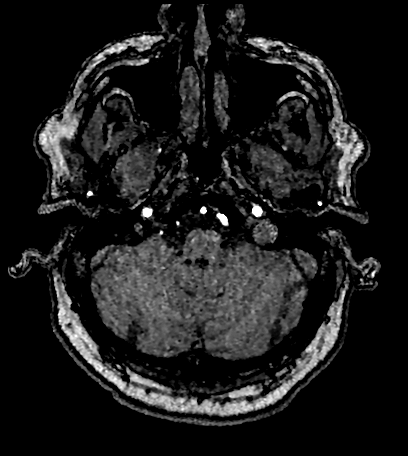
[im 46/217]
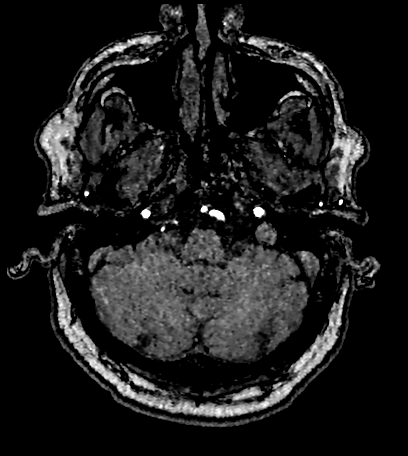
[im 51/217]
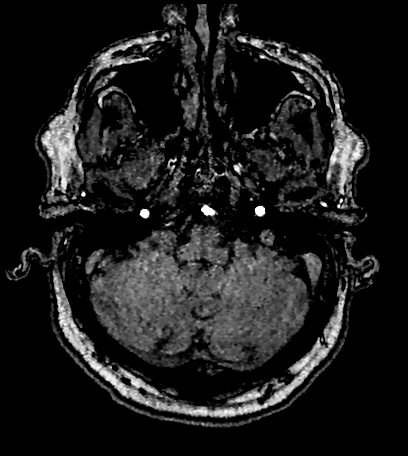
[im 56/217]
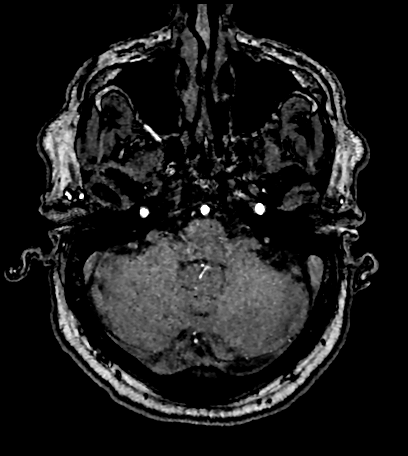
[im 60/217]
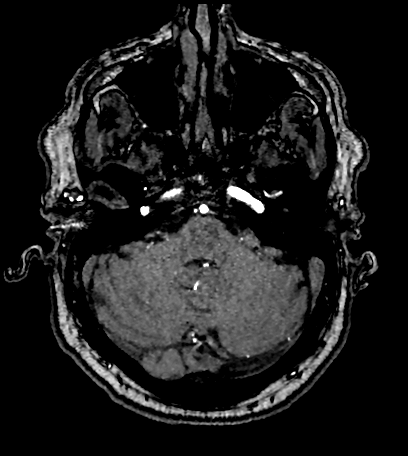
[im 65/217]
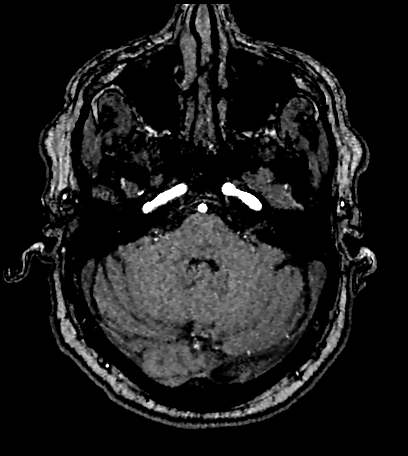
[im 69/217]
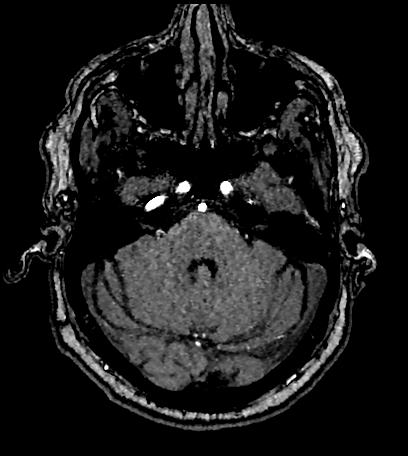
[im 74/217]
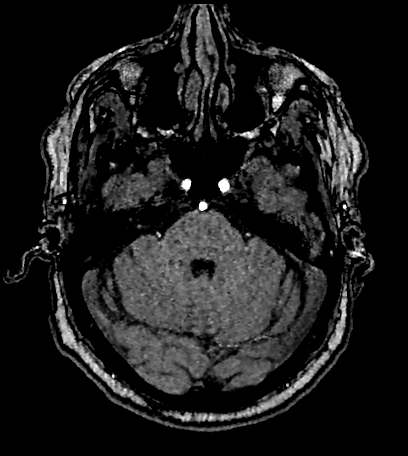
[im 79/217]
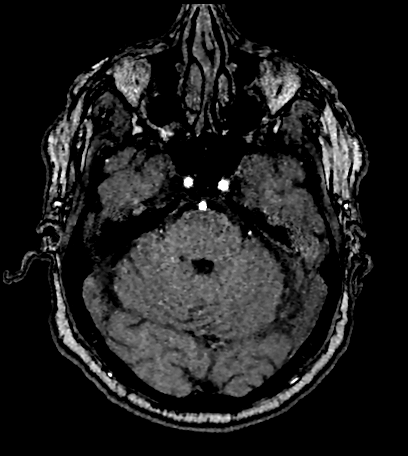
[im 83/217]
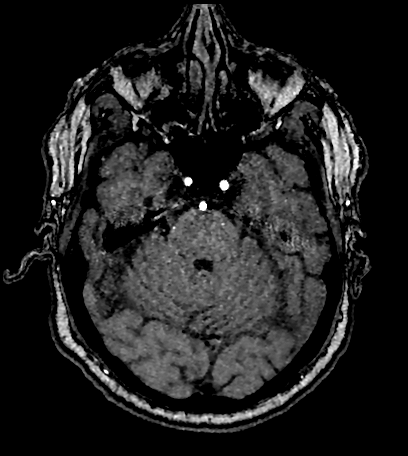
[im 97/217]
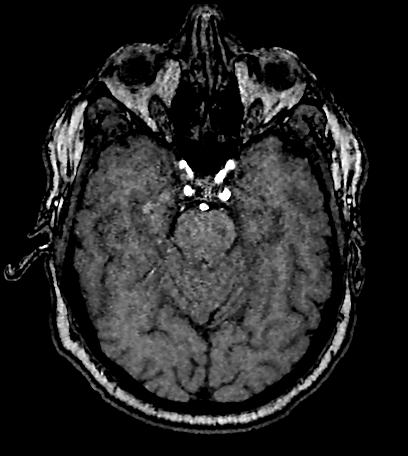
[im 111/217]
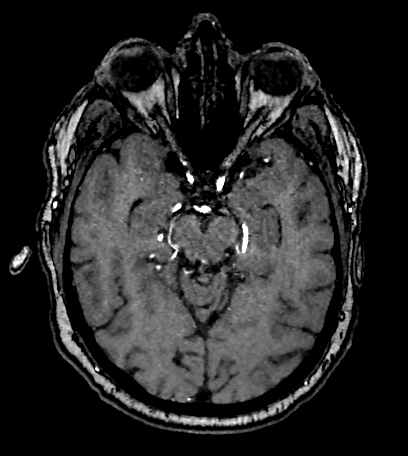
[im 125/217]
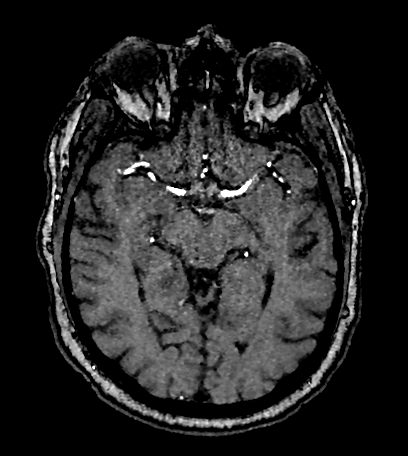
[im 152/217]
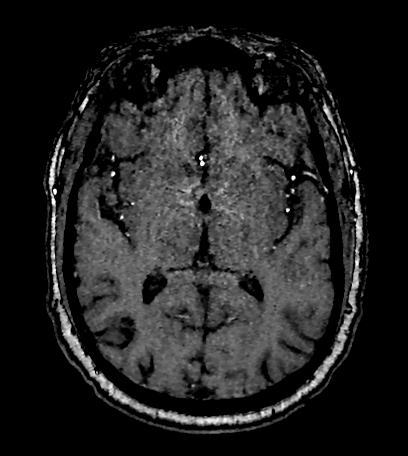
[im 180/217]
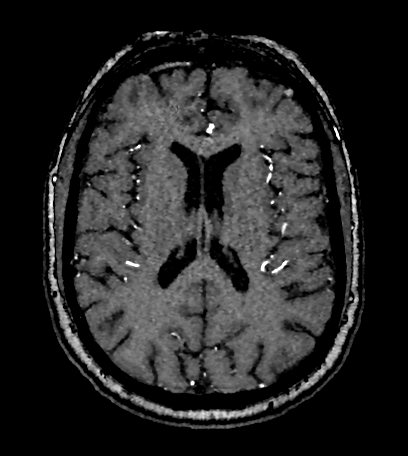
[im 184/217]
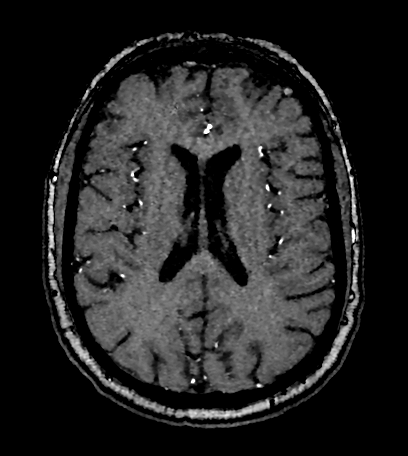
[im 207/217]
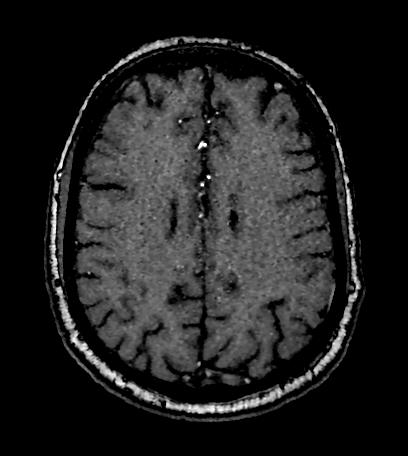

[26 of 48 positions shown; findings below may reference images not displayed]

FINDINGS: MRI HEAD

Brain: There is no acute infarction or intracranial hemorrhage.
There is no intracranial mass, mass effect, or edema. There is no
hydrocephalus or extra-axial fluid collection. Ventricles and sulci
are normal in size and configuration. Minimal punctate foci of T2
hyperintensity in the supratentorial white matter likely reflect
nonspecific gliosis/demyelination.

Vascular: Major vessel flow voids at the skull base are preserved.

Skull and upper cervical spine: Normal marrow signal is preserved.

Sinuses/Orbits: Paranasal sinuses are aerated. Orbits are
unremarkable.

Other: Sella is unremarkable.  Mastoid air cells are clear.

MRA HEAD

Intracranial internal carotid arteries are patent with
atherosclerotic irregularity. Middle and anterior cerebral arteries
are patent. Intracranial vertebral arteries, basilar artery,
posterior cerebral arteries are patent. There is a very short
segment asymmetric fenestration of the mid to distal basilar. There
is no significant stenosis or aneurysm.

MRA NECK

Aortic arch is unremarkable. Great vessel origins are patent. There
is no proximal subclavian artery stenosis. Common, internal, and
external carotid arteries are patent. Mild plaque at the ICA origins
without stenosis. Codominant extracranial vertebral arteries are
patent. There is apparent marked stenosis of the right vertebral
origin. No evidence of dissection.
IMPRESSION: No acute infarction, hemorrhage, mass, or other significant
abnormality.

Apparent marked stenosis at the right vertebral origin without
apparent flow limitation.

No hemodynamically significant stenosis of the carotids.

## 2022-04-05 MED ORDER — GADOBUTROL 1 MMOL/ML IV SOLN
9.0000 mL | Freq: Once | INTRAVENOUS | Status: AC | PRN
  Administered 2022-04-05: 9 mL via INTRAVENOUS

## 2022-04-05 MED ORDER — CLONIDINE HCL 0.1 MG PO TABS
0.2000 mg | ORAL_TABLET | Freq: Once | ORAL | Status: AC
  Administered 2022-04-05: 0.2 mg via ORAL
  Filled 2022-04-05: qty 2

## 2022-04-05 NOTE — ED Triage Notes (Signed)
Pt via POV from home. Pt c/o blurred vision, anterior headache, and feeling unsteady on his feet. Pt states that last night it started they took his blood pressure and it was 180s systolic. Pt states he is suppose to take amlodipine but hasn't take it for 2 months. Denies hx of stroke. Denies any numbness and weakness in his extremities. Pt is A&Ox4 and NAD.

## 2022-04-05 NOTE — ED Provider Notes (Signed)
[facility] Provider Note    Event Date/Time   First MD Initiated Contact with Patient 04/05/22 1223     (approximate)   History   Blurred Vision and Headache   HPI  Reginald Simpson is a 66 y.o. male with a history of hypertension who presents with 2 episodes of blurred vision associated with feeling slightly woozy or unsteady on his feet.  These lasted for a short time and then resolved.  He had 1 episode yesterday afternoon after doing some yard work, and a second episode today.  The episode today was associated with a frontal headache.  He still has a mild headache but is otherwise asymptomatic currently.  He denies any other vision changes, difficulty speaking or understanding speech, weakness or numbness, difficulty walking, chest pain, difficulty breathing or other acute symptoms.  The patient states he was previously on amlodipine, baby aspirin, and levothyroxine but states he stopped all his medications a few months ago because he did not feel like he needed them.     Physical Exam   Triage Vital Signs: ED Triage Vitals  Enc Vitals Group     BP 04/05/22 1132 (!) 180/100     Pulse Rate 04/05/22 1132 (!) 55     Resp 04/05/22 1132 20     Temp 04/05/22 1132 97.8 F (36.6 C)     Temp Source 04/05/22 1132 Oral     SpO2 04/05/22 1132 98 %     Weight 04/05/22 1133 203 lb (92.1 kg)     Height 04/05/22 1133 5\' 8"  (1.727 m)     Head Circumference --      Peak Flow --      Pain Score 04/05/22 1132 4     Pain Loc --      Pain Edu? --      Excl. in [place]? --     Most recent vital signs: Vitals:   04/05/22 1459 04/05/22 1546  BP: 124/60 138/84  Pulse: (!) 59 64  Resp: 18 18  Temp: 98 F (36.7 C) 98.1 F (36.7 C)  SpO2: 99% 99%     General: Alert and oriented, well-appearing. CV:  Good peripheral perfusion.  Resp:  Normal effort.  Abd:  No distention.  Other:  EOMI.  PERRLA.  No facial droop.  Cranial nerves III through XII grossly intact.  5/5  motor strength and intact sensation of bilateral upper and lower extremities.  No pronator drift.  No ataxia on finger-to-nose.  Normal gait.   ED Results / Procedures / Treatments   Labs (all labs ordered are listed, but only abnormal results are displayed) Labs Reviewed  CBC - Abnormal; Notable for the following components:      Result Value   Hemoglobin 17.3 (*)    All other components within normal limits  COMPREHENSIVE METABOLIC PANEL - Abnormal; Notable for the following components:   Glucose, Bld 109 (*)    All other components within normal limits  PROTIME-INR  APTT  DIFFERENTIAL  CBG MONITORING, ED     EKG  ED ECG REPORT I, Arta Silence, the attending physician, personally viewed and interpreted this ECG.  Date: 04/05/2022 EKG Time: 1135 Rate: 57 Rhythm: normal sinus rhythm QRS Axis: normal Intervals: normal ST/T Wave abnormalities: normal Narrative Interpretation: no evidence of acute ischemia    RADIOLOGY  MR brain: I dependently viewed and interpreted the images.  There are no findings of acute infarcts.  Radiology report indicates:  IMPRESSION:  No acute infarction, hemorrhage, mass, or other significant  abnormality.     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     No hemodynamically significant stenosis of the carotids.    PROCEDURES:  Critical Care performed: No  Procedures   MEDICATIONS ORDERED IN ED: Medications  cloNIDine (CATAPRES) tablet 0.2 mg (0.2 mg Oral Given 04/05/22 1304)  gadobutrol (GADAVIST) 1 MMOL/ML injection 9 mL (9 mLs Intravenous Contrast Given 04/05/22 1356)     IMPRESSION / MDM / ASSESSMENT AND PLAN / ED COURSE  I reviewed the triage vital signs and the nursing notes.  66 year old male with PMH as noted above presents with 2 episodes of generalized blurred vision and a sense of feeling wobbly or unsteady on his feet but with no actual ataxia.  He has a mild  headache but is otherwise currently asymptomatic.  On exam the patient is well-appearing.  Except for hypertension, his vital signs are normal.  The physical exam is otherwise unremarkable.  Neurologic exam is normal.  I reviewed the past medical records.  The patient was most recently seen by Dr. Caryn Section from family practice on 12/23/2021 and at that time was being managed for hypertension, hypothyroidism, and depression.  The patient states that since that time he discontinued his amlodipine and other medications.  Differential diagnosis includes, but is not limited to, hypertension related symptoms, TIA, complex migraine.  There is no evidence of CVA at this time given the resolved symptoms.  We will give oral clonidine, obtain basic labs, and an MRI and MRI of the head and neck.  ----------------------------------------- 4:18 PM on 04/05/2022 -----------------------------------------  Blood pressure normalized after clonidine.  CBC is normal except for slightly elevated hemoglobin.  BMP reveals normal electrolytes and creatinine.  MRI/MRI are negative except for stenosis of the right vertebral artery but with no effect on flow.  On reassessment the patient remains asymptomatic.  He feels well and would like to go home.  I consulted Dr. Leonel Ramsay from neurology and discussed the case with him.  He agrees with outpatient management and recommends restarting the patient on his amlodipine and baby aspirin with outpatient follow-up.  The patient states that he has adequate (not expired) amlodipine and baby aspirin to cover him until he can follow-up and agrees to take it.  I gave him thorough return precautions and he expressed understanding he is stable for discharge at this time.    FINAL CLINICAL IMPRESSION(S) / ED DIAGNOSES   Final diagnoses:  Hypertension, unspecified type  Blurred vision     Rx / DC Orders   ED Discharge Orders     None        Note:  This document was  prepared using Dragon voice recognition software and may include unintentional dictation errors.    Arta Silence, MD 04/05/22 [phone]

## 2022-04-05 NOTE — Discharge Instructions (Addendum)
Your MRI shows stenosis (narrowing) in your right vertebral artery but this is not causing any problems with blood flow at this time.  Your symptoms are likely related to your hypertension.  There is no sign of a stroke or TIA at this time.  Follow-up with your primary care doctor within the next week.  Restart your amlodipine and take it daily.  You should also take an 81 mg aspirin daily.  Return to the ER immediately for new, worsening, or persistent severe blurred vision, lightheadedness, feeling unsteady on her feet, difficulty walking, speech disturbance, weakness or numbness in your arms or legs, or any other new or worsening symptoms that concern you.

## 2022-04-08 ENCOUNTER — Encounter: Payer: Self-pay | Admitting: Family Medicine

## 2022-04-08 ENCOUNTER — Ambulatory Visit (INDEPENDENT_AMBULATORY_CARE_PROVIDER_SITE_OTHER): Payer: Self-pay | Admitting: Family Medicine

## 2022-04-08 VITALS — BP 163/79 | HR 57 | Temp 97.6°F | Resp 16 | Ht 68.0 in | Wt 212.3 lb

## 2022-04-08 DIAGNOSIS — I10 Essential (primary) hypertension: Secondary | ICD-10-CM

## 2022-04-08 DIAGNOSIS — I6501 Occlusion and stenosis of right vertebral artery: Secondary | ICD-10-CM

## 2022-04-08 DIAGNOSIS — G459 Transient cerebral ischemic attack, unspecified: Secondary | ICD-10-CM

## 2022-04-08 MED ORDER — HYDROCHLOROTHIAZIDE 12.5 MG PO TABS: 12.5000 mg | ORAL_TABLET | Freq: Every day | ORAL | 2 refills | Status: DC

## 2022-04-08 NOTE — Progress Notes (Unsigned)
I,[name],acting as a scribe for Lelon Huh, MD.,have documented all relevant documentation on the behalf of Lelon Huh, MD,as directed by  Lelon Huh, MD while in the presence of Lelon Huh, MD.  Established patient visit   Patient: Reginald Simpson   DOB: October 03, 1956   66 y.o. Male  MRN: [id number] Visit Date: 04/08/2022  Today's healthcare provider: Lelon Huh, MD   Chief Complaint  Patient presents with   Hypertension    ER f/u 04-05-22   Subjective    HPI HPI     Hypertension    Additional comments: ER f/u 04-05-22      Last edited by Alanson Puls, CMA on 04/08/2022  2:29 PM.      Follow up ER visit  Patient was seen in ER for hypertension and blurred vision on 04-05-22. Treatment for this included MRI/negative except for stenosis of the right vertebral artery but with no effect on flow. Recommends restarting the patient on his amlodipine and baby aspirin with outpatient follow-up. He reports excellent compliance with treatment since ED visit.  Was out med prior.   He reports this condition is Improved.  -----------------------------------------------------------------------------------------   Hypertension, follow-up  BP Readings from Last 3 Encounters:  04/08/22 (!) 163/79  04/05/22 138/84  12/27/21 (!) 170/79   Wt Readings from Last 3 Encounters:  04/08/22 212 lb 4.8 oz (96.3 kg)  04/05/22 203 lb (92.1 kg)  12/27/21 212 lb (96.2 kg)     He was last seen for hypertension 3 months ago.  BP at that visit was 161 90. Management since that visit includes .  He reports excellent compliance with treatment since ER visit. He is not having side effects. He is following a Low Sodium diet. He is exercising. He does not smoke.  Use of agents associated with hypertension: none.   Outside blood pressures are average: 150/82 Symptoms: No chest pain No chest pressure  No palpitations No syncope  No dyspnea No orthopnea  No paroxysmal  nocturnal dyspnea No lower extremity edema   Pertinent labs Lab Results  Component Value Date   CHOL 215 (H) 12/23/2021   HDL 57 12/23/2021   LDLCALC 134 (H) 12/23/2021   TRIG 137 12/23/2021   CHOLHDL 3.8 12/23/2021   Lab Results  Component Value Date   NA 138 04/05/2022   K 4.0 04/05/2022   CREATININE 0.83 04/05/2022   GFRNONAA >60 04/05/2022   GLUCOSE 109 (H) 04/05/2022   TSH 3.080 12/23/2021     The 10-year ASCVD risk score (Arnett DK, et al., 2019) is: 19.9%  ---------------------------------------------------------------------------------------------------  Medications: Outpatient Medications Prior to Visit  Medication Sig   amLODipine (NORVASC) 5 MG tablet Take 1 tablet (5 mg total) by mouth daily.   aspirin 81 MG tablet Take 81 mg by mouth daily.   escitalopram (LEXAPRO) 10 MG tablet TAKE ONE TABLET BY MOUTH ONE TIME DAILY   levothyroxine (SYNTHROID) 137 MCG tablet TAKE 1 TABLET BY MOUTH EVERY DAY   No facility-administered medications prior to visit.    Review of Systems  {Labs  Heme  Chem  Endocrine  Serology  Results Review (optional):23779}   Objective    BP (!) 163/79 (BP Location: Right Arm, Patient Position: Sitting, Cuff Size: Normal)   Pulse (!) 57   Temp 97.6 F (36.4 C) (Oral)   Resp 16   Ht 5\' 8"  (1.727 m)   Wt 212 lb 4.8 oz (96.3 kg)   SpO2 98%  BMI 32.28 kg/m  {Show previous vital signs (optional):23777}  Physical Exam  ***  No results found for any visits on 04/08/22.  Assessment & Plan     ***  No follow-ups on file.      {provider attestation***:1}   Lelon Huh, MD  [facility] [phone] (phone) [phone] (fax)  [place]

## 2022-04-16 ENCOUNTER — Ambulatory Visit: Payer: No Typology Code available for payment source | Admitting: Family Medicine

## 2022-05-02 NOTE — Progress Notes (Signed)
I,Roshena L Chambers,acting as a scribe for [name], MD.,have documented all relevant documentation on the behalf of [name], MD,as directed by  [name], MD while in the presence of [name], MD.   Established patient visit   Patient: Reginald Simpson   DOB: [birth year]   66 y.o. Male  MRN: [id number] Visit Date: 05/05/2022  Today's healthcare provider: [name], MD   Chief Complaint  Patient presents with   Hypertension   Subjective    Hypertension, follow-up  BP Readings from Last 3 Encounters:  05/05/22 135/77  04/08/22 (!) 163/79  04/05/22 138/84   Wt Readings from Last 3 Encounters:  05/05/22 215 lb (97.5 kg)  04/08/22 212 lb 4.8 oz (96.3 kg)  04/05/22 203 lb (92.1 kg)     He was last seen for hypertension 1 month ago.  BP at that visit was 163/79. Management since that visit includes: continue Anmlodipine. Add Hydrochlorothiazide 12.5 mg daily.   He reports good compliance with treatment. He is not having side effects He is following a Regular diet. He is exercising. He does not smoke.  Use of agents associated with hypertension: NSAIDS.   Outside blood pressures are 140/80. Symptoms: No chest pain No chest pressure  No palpitations No syncope  No dyspnea No orthopnea  No paroxysmal nocturnal dyspnea No lower extremity edema   Pertinent labs Lab Results  Component Value Date   CHOL 215 (H) 12/23/2021   HDL 57 12/23/2021   LDLCALC 134 (H) 12/23/2021   TRIG 137 12/23/2021   CHOLHDL 3.8 12/23/2021   Lab Results  Component Value Date   NA 138 04/05/2022   K 4.0 04/05/2022   CREATININE 0.83 04/05/2022   GFRNONAA >60 04/05/2022   GLUCOSE 109 (H) 04/05/2022   TSH 3.080 12/23/2021     The 10-year ASCVD risk score (Arnett DK, et al., 2019) is: 14.6%  ---------------------------------------------------------------------------------------------------   Medications: Outpatient Medications Prior to Visit  Medication Sig    amLODipine (NORVASC) 5 MG tablet Take 1 tablet (5 mg total) by mouth daily.   aspirin 81 MG tablet Take 81 mg by mouth daily.   hydrochlorothiazide (HYDRODIURIL) 12.5 MG tablet Take 1 tablet (12.5 mg total) by mouth daily.   No facility-administered medications prior to visit.    Review of Systems  Constitutional:  Negative for appetite change, chills and fever.  Respiratory:  Negative for chest tightness, shortness of breath and wheezing.   Cardiovascular:  Negative for chest pain and palpitations.  Gastrointestinal:  Negative for abdominal pain, nausea and vomiting.       Objective    BP 135/77 (BP Location: Left Arm, Patient Position: Sitting, Cuff Size: Large)   Pulse 60   Temp (!) 97.5 F (36.4 C) (Oral)   Resp 14   Wt 215 lb (97.5 kg)   SpO2 96% Comment: room air  BMI 32.69 kg/m    Today's Vitals   05/05/22 0944 05/05/22 0947  BP: (!) 148/83 135/77  Pulse: 60   Resp: 14   Temp: (!) 97.5 F (36.4 C)   TempSrc: Oral   SpO2: 96%   Weight: 215 lb (97.5 kg)    Body mass index is 32.69 kg/m.   Physical Exam   General appearance: Mildly obese male, cooperative and in no acute distress Head: Normocephalic, without obvious abnormality, atraumatic Respiratory: Respirations even and unlabored, normal respiratory rate Extremities: All extremities are intact.  Skin: Skin color, texture, turgor normal. No rashes seen  Psych: Appropriate mood and affect. Neurologic: Mental status: Alert, oriented to person, place, and time, thought content appropriate.   Assessment & Plan     1. Primary hypertension Doing well with BP near goal with addition of hctz.  Continue current medications.   - Renal function panel  2. Hypothyroidism, unspecified type Now off levothyroxine  - TSH  3. Hyperglycemia  - Hemoglobin A1c  4. Mixed hyperlipidemia Controlling with diet.  - Lipid panel         The entirety of the information documented in the History of Present  Illness, Review of Systems and Physical Exam were personally obtained by me. Portions of this information were initially documented by the CMA and reviewed by me for thoroughness and accuracy.     [name], MD  [facility] [phone] (phone) [phone] (fax)  [facility]

## 2022-05-05 ENCOUNTER — Encounter: Payer: Self-pay | Admitting: Family Medicine

## 2022-05-05 ENCOUNTER — Ambulatory Visit (INDEPENDENT_AMBULATORY_CARE_PROVIDER_SITE_OTHER): Payer: No Typology Code available for payment source | Admitting: Family Medicine

## 2022-05-05 VITALS — BP 135/77 | HR 60 | Temp 97.5°F | Resp 14 | Wt 215.0 lb

## 2022-05-05 DIAGNOSIS — R739 Hyperglycemia, unspecified: Secondary | ICD-10-CM

## 2022-05-05 DIAGNOSIS — E782 Mixed hyperlipidemia: Secondary | ICD-10-CM | POA: Diagnosis not present

## 2022-05-05 DIAGNOSIS — I10 Essential (primary) hypertension: Secondary | ICD-10-CM | POA: Diagnosis not present

## 2022-05-05 DIAGNOSIS — E039 Hypothyroidism, unspecified: Secondary | ICD-10-CM

## 2022-05-06 ENCOUNTER — Encounter: Payer: Self-pay | Admitting: Family Medicine

## 2022-05-06 ENCOUNTER — Other Ambulatory Visit: Payer: Self-pay | Admitting: Family Medicine

## 2022-05-06 DIAGNOSIS — E785 Hyperlipidemia, unspecified: Secondary | ICD-10-CM

## 2022-05-06 DIAGNOSIS — E039 Hypothyroidism, unspecified: Secondary | ICD-10-CM

## 2022-05-06 DIAGNOSIS — I10 Essential (primary) hypertension: Secondary | ICD-10-CM

## 2022-05-06 LAB — HEMOGLOBIN A1C
Est. average glucose Bld gHb Est-mCnc: 120 mg/dL
Hgb A1c MFr Bld: 5.8 % — ABNORMAL HIGH (ref 4.8–5.6)

## 2022-05-06 LAB — LIPID PANEL
Chol/HDL Ratio: 4.2 ratio (ref 0.0–5.0)
Cholesterol, Total: 221 mg/dL — ABNORMAL HIGH (ref 100–199)
HDL: 53 mg/dL (ref >39)
LDL Chol Calc (NIH): 127 mg/dL — ABNORMAL HIGH (ref 0–99)
Triglycerides: 232 mg/dL — ABNORMAL HIGH (ref 0–149)
VLDL Cholesterol Cal: 41 mg/dL — ABNORMAL HIGH (ref 5–40)

## 2022-05-06 LAB — RENAL FUNCTION PANEL
Albumin: 4.3 g/dL (ref 3.8–4.8)
BUN/Creatinine Ratio: 18 (ref 10–24)
BUN: 15 mg/dL (ref 8–27)
CO2: 28 mmol/L (ref 20–29)
Calcium: 9.4 mg/dL (ref 8.6–10.2)
Chloride: 100 mmol/L (ref 96–106)
Creatinine, Ser: 0.84 mg/dL (ref 0.76–1.27)
Glucose: 130 mg/dL — ABNORMAL HIGH (ref 70–99)
Phosphorus: 3.3 mg/dL (ref 2.8–4.1)
Potassium: 4.6 mmol/L (ref 3.5–5.2)
Sodium: 140 mmol/L (ref 134–144)
eGFR: 97 mL/min/{1.73_m2} (ref >59)

## 2022-05-06 LAB — TSH: TSH: 9.89 u[IU]/mL — ABNORMAL HIGH (ref 0.450–4.500)

## 2022-05-06 MED ORDER — LEVOTHYROXINE SODIUM 25 MCG PO TABS: 25.0000 ug | ORAL_TABLET | Freq: Every day | ORAL | 1 refills | Status: DC

## 2022-05-06 MED ORDER — ROSUVASTATIN CALCIUM 5 MG PO TABS: 5.0000 mg | ORAL_TABLET | Freq: Every day | ORAL | 1 refills | Status: DC

## 2022-07-10 ENCOUNTER — Other Ambulatory Visit: Payer: Self-pay | Admitting: Family Medicine

## 2022-10-13 DIAGNOSIS — H524 Presbyopia: Secondary | ICD-10-CM | POA: Diagnosis not present

## 2022-10-17 NOTE — Progress Notes (Deleted)
Annual Wellness Visit     Patient: Reginald Simpson, Male    DOB: 1956-06-18, 66 y.o.   MRN: [id number] Visit Date: 10/20/2022  Today's Provider: [name], MD   No chief complaint on file.  Subjective    Reginald Simpson is a 66 y.o. male who presents today for his Annual Wellness Visit. He reports consuming a {diet types:17450} diet. {Exercise:19826} He generally feels {well/fairly well/poorly:18703}. He reports sleeping {well/fairly well/poorly:18703}. He {does/does not:200015} have additional problems to discuss today.   HPI    Medications: Outpatient Medications Prior to Visit  Medication Sig   amLODipine (NORVASC) 5 MG tablet Take 1 tablet (5 mg total) by mouth daily.   aspirin 81 MG tablet Take 81 mg by mouth daily.   hydrochlorothiazide (HYDRODIURIL) 12.5 MG tablet Take 1 tablet (12.5 mg total) by mouth daily.   levothyroxine (SYNTHROID) 25 MCG tablet Take 1 tablet (25 mcg total) by mouth daily.   rosuvastatin (CRESTOR) 5 MG tablet Take 1 tablet (5 mg total) by mouth daily.   No facility-administered medications prior to visit.    No Known Allergies  Patient Care Team: Malva Limes, MD as PCP - General (Family Medicine) Lonna Cobb, Verna Czech, MD (Urology)  Review of Systems  All other systems reviewed and are negative.   {Labs  Heme  Chem  Endocrine  Serology  Results Review (optional):23779}    Objective    Vitals: There were no vitals taken for this visit. {Show previous vital signs (optional):23777}   Physical Exam ***  Most recent functional status assessment:    12/23/2021    9:15 AM  In your present state of health, do you have any difficulty performing the following activities:  Hearing? 0  Vision? 0  Comment wears readers  Difficulty concentrating or making decisions? 0  Walking or climbing stairs? 0  Dressing or bathing? 0  Doing errands, shopping? 0   Most recent fall risk assessment:    12/23/2021    9:15 AM  Fall Risk    Falls in the past year? 0  Number falls in past yr: 0  Injury with Fall? 0  Risk for fall due to : No Fall Risks    Most recent depression screenings:    05/05/2022    9:49 AM 12/23/2021    9:15 AM  PHQ 2/9 Scores  PHQ - 2 Score 0 0  PHQ- 9 Score 0 0   Most recent cognitive screening:     No data to display         Most recent Audit-C alcohol use screening    12/23/2021    9:13 AM  Alcohol Use Disorder Test (AUDIT)  1. How often do you have a drink containing alcohol? 4  2. How many drinks containing alcohol do you have on a typical day when you are drinking? 0  3. How often do you have six or more drinks on one occasion? 0  AUDIT-C Score 4   A score of 3 or more in women, and 4 or more in men indicates increased risk for alcohol abuse, EXCEPT if all of the points are from question 1   No results found for any visits on 10/20/22.  Assessment & Plan     Annual wellness visit done today including the all of the following: Reviewed patient's Family Medical History Reviewed and updated list of patient's medical providers Assessment of cognitive impairment was done Assessed patient's functional ability Established a written  schedule for health screening services Health Risk Assessent Completed and Reviewed  Exercise Activities and Dietary recommendations  Goals   None     Immunization History  Administered Date(s) Administered   Janssen (J&J) SARS-COV-2 Vaccination 03/18/2020   Tdap 01/05/2008, 07/11/2019    Health Maintenance  Topic Date Due   Medicare Annual Wellness (AWV)  Never done   HIV Screening  Never done   Zoster Vaccines- Shingrix (1 of 2) Never done   INFLUENZA VACCINE  Never done   COVID-19 Vaccine (2 - 2023-24 season) 07/18/2022   Pneumonia Vaccine 57+ Years old (1 - PCV) 10/12/2022   Fecal DNA (Cologuard)  02/22/2023   DTaP/Tdap/Td (3 - Td or Tdap) 07/10/2029   Hepatitis C Screening  Completed   HPV VACCINES  Aged Out   COLONOSCOPY (Pts  45-65yrs Insurance coverage will need to be confirmed)  Discontinued     Discussed health benefits of physical activity, and encouraged him to engage in regular exercise appropriate for his age and condition.    ***  No follow-ups on file.     {provider attestation***:1}   [name], MD  [facility] [phone] (phone) [phone] (fax)  [facility]

## 2022-10-20 ENCOUNTER — Ambulatory Visit (INDEPENDENT_AMBULATORY_CARE_PROVIDER_SITE_OTHER): Payer: Medicare HMO | Admitting: Family Medicine

## 2022-10-20 ENCOUNTER — Encounter: Payer: Self-pay | Admitting: Family Medicine

## 2022-10-20 VITALS — BP 136/79 | HR 57 | Resp 16 | Ht 68.0 in | Wt 215.0 lb

## 2022-10-20 DIAGNOSIS — E039 Hypothyroidism, unspecified: Secondary | ICD-10-CM | POA: Diagnosis not present

## 2022-10-20 DIAGNOSIS — Z0000 Encounter for general adult medical examination without abnormal findings: Secondary | ICD-10-CM

## 2022-10-20 DIAGNOSIS — E782 Mixed hyperlipidemia: Secondary | ICD-10-CM

## 2022-10-20 DIAGNOSIS — I6501 Occlusion and stenosis of right vertebral artery: Secondary | ICD-10-CM | POA: Diagnosis not present

## 2022-10-20 DIAGNOSIS — R7303 Prediabetes: Secondary | ICD-10-CM

## 2022-10-20 DIAGNOSIS — I10 Essential (primary) hypertension: Secondary | ICD-10-CM

## 2022-10-20 NOTE — Patient Instructions (Addendum)
Please review the attached list of medications and notify my office if there are any errors.   The CDC recommends two doses of Shingrix (the shingles vaccine) separated by 2 to 6 months for adults age 65 years and older. I recommend checking with your insurance plan regarding coverage for this vaccine.   I recommend that you get the Prevnar 20 vaccine to protect yourself from certain dangerous strains of pneumonia. You can get Prevnar 20 at your pharmacy, or call our office at [phone] at your earliest convenience to schedule this vaccine.   You will be due for Cologuard in April, 2024. You should receive a collection kit in the mail before the end of April  We will contact you in February to set up a time to recheck your cholesterol. Let me know if you have any problems or side effects from the rosuvastatin before then

## 2022-10-20 NOTE — Progress Notes (Signed)
I,[name],acting as a scribe for [name], MD.,have documented all relevant documentation on the behalf of [name], MD,as directed by  [name], MD while in the presence of [name], MD.   Medicare Initial Preventative Physical Exam    Patient: Reginald Simpson, Male    DOB: 1956/01/15, 66 y.o.   MRN: [id number] Visit Date: 10/20/2022  Today's Provider: [name], MD   Subjective:    Chief Complaint  Patient presents with   Medicare Wellness    Medicare Initial Preventative Physical Exam Reginald Simpson is a 66 y.o. male who presents today for his Initial Preventative Physical   He is generally doing well. He has history of pre-diabetes, last A1c was 5.8 on 05/05/2022. He was prescribed rosuvastatin in June due to increased h/o suspect TIA related to vertebral artery stenosis. He states he filled prescription, but never started medication because he doesn't like taking so many medications.   He is also due to check on thyroid functions. Since last visit he has started back on levothyroxine, but only taking 1/2 tablet daily.  Lab Results  Component Value Date   TSH 9.890 (H) 05/05/2022      Social History   Socioeconomic History   Marital status: Married    Spouse name: Not on file   Number of children: 3   Years of education: Not on file   Highest education level: Not on file  Occupational History   Occupation: Self employed  Tobacco Use   Smoking status: Never   Smokeless tobacco: Never  Vaping Use   Vaping Use: Never used  Substance and Sexual Activity   Alcohol use: Yes    Alcohol/week: 0.0 standard drinks of alcohol    Comment: 2-3 beers or glasses of wine 3 night each week   Drug use: No   Sexual activity: Not on file  Other Topics Concern   Not on file  Social History Narrative   Not on file   Social Determinants of Health   Financial Resource Strain: Not on file  Food Insecurity: Not on file  Transportation Needs: Not on  file  Physical Activity: Not on file  Stress: Not on file  Social Connections: Not on file  Intimate Partner Violence: Not on file    Past Medical History:  Diagnosis Date   Colon polyps    Hyperglycemia      Patient Active Problem List   Diagnosis Date Noted   Depression, recurrent (HCC) 12/21/2019   Primary hypertension 12/21/2019   Hypothyroid 03/27/2016   Obesity 03/27/2016   Chest wall pain 03/27/2016   Hyperglycemia 03/27/2016   Situational stress 03/27/2016   Family history of prostate cancer 06/17/2010   Hematuria 01/22/2010   History of adenomatous polyp of colon 02/04/2008    Past Surgical History:  Procedure Laterality Date   BACK SURGERY  1994   Ruptured lumbar disk   COLONOSCOPY  07/07/2013   Mild active colitis of Cecum, Hyperplastic polyp in rectum. [facility]   SHOULDER ARTHROSCOPY Right 03/2017   Dr. Thomasena Edis,, [facility]   SHOULDER ARTHROSCOPY Left 03/2019    His family history includes Hypertension in his mother; Prostate cancer (age of onset: 23) in his father.   Current Outpatient Medications:    amLODipine (NORVASC) 5 MG tablet, Take 1 tablet (5 mg total) by mouth daily., Disp: 90 tablet, Rfl: 3   aspirin 81 MG tablet, Take 81 mg by mouth daily., Disp: , Rfl:  hydrochlorothiazide (HYDRODIURIL) 12.5 MG tablet, Take 1 tablet (12.5 mg total) by mouth daily., Disp: 90 tablet, Rfl: 1   levothyroxine (SYNTHROID) 137 MCG tablet, Take 0.5 tablets by mouth daily before breakfast., Disp: , Rfl:    rosuvastatin (CRESTOR) 5 MG tablet, Take 1 tablet (5 mg total) by mouth daily. (Patient not taking: Reported on 10/20/2022), Disp: 90 tablet, Rfl: 1   Patient Care Team: Malva Limes, MD as PCP - General (Family Medicine) Lonna Cobb, Verna Czech, MD (Urology)  Review of Systems  Constitutional:  Negative for appetite change, chills, diaphoresis and fever.  HENT:  Negative for congestion, ear discharge, ear pain, hearing loss,  nosebleeds, sore throat and tinnitus.   Eyes:  Negative for photophobia, pain, discharge and redness.  Respiratory:  Negative for cough, chest tightness, shortness of breath, wheezing and stridor.   Cardiovascular:  Negative for chest pain, palpitations and leg swelling.  Gastrointestinal:  Negative for abdominal pain, blood in stool, constipation, diarrhea, nausea and vomiting.  Endocrine: Negative for polydipsia.  Genitourinary:  Negative for dysuria, flank pain, frequency, hematuria and urgency.  Musculoskeletal:  Negative for back pain, myalgias and neck pain.  Skin:  Negative for rash.  Allergic/Immunologic: Negative for environmental allergies.  Neurological:  Negative for dizziness, tremors, seizures, weakness and headaches.  Hematological:  Does not bruise/bleed easily.  Psychiatric/Behavioral:  Negative for hallucinations and suicidal ideas. The patient is not nervous/anxious.        Objective:    Vitals: BP 136/79 (BP Location: Left Arm, Patient Position: Sitting, Cuff Size: Large)   Pulse (!) 57   Resp 16   Ht 5\' 8"  (1.727 m)   Wt 215 lb (97.5 kg)   SpO2 94%   BMI 32.69 kg/m  No results found. Physical Exam   General Appearance:    Mildly obese male. Alert, cooperative, in no acute distress, appears stated age  Head:    Normocephalic, without obvious abnormality, atraumatic  Eyes:    PERRL, conjunctiva/corneas clear, EOM's intact, fundi    benign, both eyes       Ears:    Normal TM's and external ear canals, both ears  Nose:   Nares normal, septum midline, mucosa normal, no drainage   or sinus tenderness  Throat:   Lips, mucosa, and tongue normal; teeth and gums normal  Neck:   Supple, symmetrical, trachea midline, no adenopathy;       thyroid:  No enlargement/tenderness/nodules; no carotid   bruit or JVD  Back:     Symmetric, no curvature, ROM normal, no CVA tenderness  Lungs:     Clear to auscultation bilaterally, respirations unlabored  Chest wall:    No  tenderness or deformity  Heart:    Bradycardic. Normal rhythm. No murmurs, rubs, or gallops.  S1 and S2 normal  Abdomen:     Soft, non-tender, bowel sounds active all four quadrants,    no masses, no organomegaly  Genitalia:    deferred  Rectal:    deferred  Extremities:   All extremities are intact. No cyanosis or edema  Pulses:   2+ and symmetric all extremities  Skin:   Skin color, texture, turgor normal, no rashes or lesions  Lymph nodes:   Cervical, supraclavicular, and axillary nodes normal  Neurologic:   CNII-XII intact. Normal strength, sensation and reflexes      throughout     Activities of Daily Living    12/23/2021    9:15 AM  In your present state of health, do  you have any difficulty performing the following activities:  Hearing? 0  Vision? 0  Comment wears readers  Difficulty concentrating or making decisions? 0  Walking or climbing stairs? 0  Dressing or bathing? 0  Doing errands, shopping? 0    Fall Risk Assessment    12/23/2021    9:15 AM 09/10/2020    8:18 AM 07/11/2019    3:28 PM  Fall Risk   Falls in the past year? 0 0 0  Number falls in past yr: 0 0   Injury with Fall? 0 0   Risk for fall due to : No Fall Risks    Follow up  Falls evaluation completed Falls evaluation completed     Depression Screen    05/05/2022    9:49 AM 12/23/2021    9:15 AM 09/10/2020    8:08 AM 05/30/2020    9:22 AM  PHQ 2/9 Scores  PHQ - 2 Score 0 0 2 2  PHQ- 9 Score 0 0 5 7        No data to display         EKG - NSR, No acute changes   Assessment & Plan:    Initial Preventative Physical Exam  Reviewed patient's Family Medical History Reviewed and updated list of patient's medical providers Assessment of cognitive impairment was done Assessed patient's functional ability Established a written schedule for health screening services Health Risk Assessent Completed and Reviewed  Exercise Activities and Dietary recommendations  Goals   None      Immunization History  Administered Date(s) Administered   Janssen (J&J) SARS-COV-2 Vaccination 03/18/2020   Tdap 01/05/2008, 07/11/2019    Health Maintenance  Topic Date Due   Medicare Annual Wellness (AWV)  Never done   HIV Screening  Never done   Zoster Vaccines- Shingrix (1 of 2) Never done   INFLUENZA VACCINE  Never done   COVID-19 Vaccine (2 - 2023-24 season) 07/18/2022   Pneumonia Vaccine 75+ Years old (1 - PCV) 10/12/2022   Fecal DNA (Cologuard)  02/22/2023   DTaP/Tdap/Td (3 - Td or Tdap) 07/10/2029   Hepatitis C Screening  Completed   HPV VACCINES  Aged Out   COLONOSCOPY (Pts 45-66yrs Insurance coverage will need to be confirmed)  Discontinued     Discussed health benefits of physical activity, and encouraged him to engage in regular exercise appropriate for his age and condition.   1. Encounter for initial preventive physical examination covered by Medicare Advised will be due for colon cancer screening in April. He prefers Cologuard. Will send order in early April, 2024.   2. Primary hypertension Fairly well controlled Continue current medications.    3. Hypothyroidism, unspecified type Now back on 1/2 tablet a day. - TSH - T4, free  4. Vertebral artery stenosis, right Likely history of TIA earlier this year. Substantially increased risk for CVA. BP is much better controlled. Counseled on importance of lipid management for reducing stroke risk and that low dose rosuvastatin is typically very well tolerated. He agreed to start taking rosuvastatin that was dispensed earlier this year. Will check lipids if tolerating statin in 2-3 months.   6. Mixed hyperlipidemia      [name], MD  [facility] [phone] (phone) [phone] (fax)  [facility]

## 2022-10-21 LAB — TSH: TSH: 6.22 u[IU]/mL — ABNORMAL HIGH (ref 0.450–4.500)

## 2022-10-21 LAB — T4, FREE: Free T4: 1.24 ng/dL (ref 0.82–1.77)

## 2022-12-12 DIAGNOSIS — M25551 Pain in right hip: Secondary | ICD-10-CM | POA: Diagnosis not present

## 2022-12-12 DIAGNOSIS — M7061 Trochanteric bursitis, right hip: Secondary | ICD-10-CM | POA: Diagnosis not present

## 2022-12-12 DIAGNOSIS — M1711 Unilateral primary osteoarthritis, right knee: Secondary | ICD-10-CM | POA: Diagnosis not present

## 2022-12-12 DIAGNOSIS — M25561 Pain in right knee: Secondary | ICD-10-CM | POA: Diagnosis not present

## 2022-12-24 ENCOUNTER — Ambulatory Visit: Payer: No Typology Code available for payment source | Admitting: Urology

## 2022-12-25 ENCOUNTER — Other Ambulatory Visit: Payer: Self-pay | Admitting: Family Medicine

## 2022-12-25 ENCOUNTER — Encounter: Payer: Self-pay | Admitting: Family Medicine

## 2022-12-25 DIAGNOSIS — E782 Mixed hyperlipidemia: Secondary | ICD-10-CM

## 2022-12-25 DIAGNOSIS — E039 Hypothyroidism, unspecified: Secondary | ICD-10-CM

## 2022-12-28 ENCOUNTER — Other Ambulatory Visit: Payer: Self-pay | Admitting: Family Medicine

## 2022-12-28 DIAGNOSIS — I10 Essential (primary) hypertension: Secondary | ICD-10-CM

## 2022-12-29 NOTE — Telephone Encounter (Signed)
Requested Prescriptions  Pending Prescriptions Disp Refills   amLODipine (NORVASC) 5 MG tablet [Pharmacy Med Name: AMLODIPINE 5 MG TAB[*]] 90 tablet 0    Sig: TAKE ONE TABLET BY MOUTH ONE TIME DAILY     Cardiovascular: Calcium Channel Blockers 2 Passed - 12/28/2022  4:59 PM      Passed - Last BP in normal range    BP Readings from Last 1 Encounters:  10/20/22 136/79         Passed - Last Heart Rate in normal range    Pulse Readings from Last 1 Encounters:  10/20/22 (!) 57         Passed - Valid encounter within last 6 months    Recent Outpatient Visits           2 months ago Encounter for initial preventive physical examination covered by [employer] [facility] Birdie Sons, MD   7 months ago Primary hypertension   [place], Donald E, MD   8 months ago Vertebral artery stenosis, right   [place] Birdie Sons, MD   1 year ago Annual physical exam   [place], Donald E, MD   2 years ago Primary hypertension   [place], Donald E, MD       Future Appointments             In 2 weeks Stoioff, Ronda Fairly, MD [place]

## 2023-01-06 ENCOUNTER — Ambulatory Visit: Payer: Self-pay

## 2023-01-06 ENCOUNTER — Ambulatory Visit (INDEPENDENT_AMBULATORY_CARE_PROVIDER_SITE_OTHER): Payer: Medicare HMO | Admitting: Family Medicine

## 2023-01-06 ENCOUNTER — Ambulatory Visit
Admission: RE | Admit: 2023-01-06 | Discharge: 2023-01-06 | Disposition: A | Discharge status: Home / Self Care | Payer: Medicare HMO | Source: Ambulatory Visit | Attending: Family Medicine | Admitting: Family Medicine

## 2023-01-06 VITALS — BP 150/78 | HR 54 | Temp 98.1°F | Wt 217.0 lb

## 2023-01-06 DIAGNOSIS — I10 Essential (primary) hypertension: Secondary | ICD-10-CM

## 2023-01-06 DIAGNOSIS — G459 Transient cerebral ischemic attack, unspecified: Secondary | ICD-10-CM

## 2023-01-06 DIAGNOSIS — I6501 Occlusion and stenosis of right vertebral artery: Secondary | ICD-10-CM | POA: Diagnosis not present

## 2023-01-06 DIAGNOSIS — H539 Unspecified visual disturbance: Secondary | ICD-10-CM

## 2023-01-06 DIAGNOSIS — R519 Headache, unspecified: Secondary | ICD-10-CM | POA: Diagnosis not present

## 2023-01-06 MED ORDER — GADOBUTROL 1 MMOL/ML IV SOLN
9.0000 mL | Freq: Once | INTRAVENOUS | Status: AC | PRN
  Administered 2023-01-06: 9 mL via INTRAVENOUS

## 2023-01-06 MED ORDER — AMLODIPINE BESYLATE 5 MG PO TABS: 10.0000 mg | ORAL_TABLET | Freq: Every day | ORAL | Status: DC

## 2023-01-06 NOTE — Telephone Encounter (Signed)
  Chief Complaint: headache Symptoms: stiff neck (earlier in am has dizziness and blurred vision -wife stated has resolved Frequency: this am  Pertinent Negatives: Patient denies unable to reach pt  Disposition: []$ ED /[]$ Urgent Care (no appt availability in office) / []$ Appointment(In office/virtual)/ []$  [facility]/ []$ Home Care/ [x]$ Refused Recommended Disposition /[]$ [facility]/ []$  Follow-up with PCP Additional Notes: wife was curt dueing call. Tried to get information but she was not with pt. Stated yesterday his systolic BP was 123XX123?. Pt adamant for pt to get appt versus ED. Made appt. After hangin up with wife attempted to call pt and did not get an answer. Pt continues with stiff neck and headache. Had attempted to explain why she was talking to me based on his complaints. Not interested and only wanted appt. Called Burnett Lieber at [facility] and advised her of call. Lucile Crater that my call may have come up as spam and she stated she will try and get in touch with him.  Reason for Disposition  Stiff neck (can't touch chin to chest)  Answer Assessment - Initial Assessment Questions 1. LOCATION: "Where does it hurt?"      *No Answer* 2. ONSET: "When did the headache start?" (Minutes, hours or days)      *No Answer* 3. PATTERN: "Does the pain come and go, or has it been constant since it started?"     *No Answer* 4. SEVERITY: "How bad is the pain?" and "What does it keep you from doing?"  (e.g., Scale 1-10; mild, moderate, or severe)   - MILD (1-3): doesn't interfere with normal activities    - MODERATE (4-7): interferes with normal activities or awakens from sleep    - SEVERE (8-10): excruciating pain, unable to do any normal activities        *No Answer* 5. RECURRENT SYMPTOM: "Have you ever had headaches before?" If Yes, ask: "When was the last time?" and "What happened that time?"      *No Answer* 6. CAUSE: "What do you think is causing the headache?"     *No Answer* 7.  MIGRAINE: "Have you been diagnosed with migraine headaches?" If Yes, ask: "Is this headache similar?"      *No Answer* 8. HEAD INJURY: "Has there been any recent injury to the head?"      *No Answer* 9. OTHER SYMPTOMS: "Do you have any other symptoms?" (fever, stiff neck, eye pain, sore throat, cold symptoms)     Earlier in am with dizziness and blurred vision (wife stated have resolved) stiff neck  10. PREGNANCY: "Is there any chance you are pregnant?" "When was your last menstrual period?"       *No Answer*  Protocols used: Headache-A-AH

## 2023-01-06 NOTE — Telephone Encounter (Signed)
Tried to reach out to the patient to advise him the ED would be the best place however unable to contact patient.  Left appt triage scheduled since we were unable to reach the patient.

## 2023-01-06 NOTE — Progress Notes (Signed)
Established patient visit   Patient: Reginald Simpson   DOB: December 20, 1955   67 y.o. Male  MRN: [id number] Visit Date: 01/06/2023  Today's healthcare provider: Lelon Huh, MD   No chief complaint on file.  Subjective    HPI  Patient is a 67 year old male who presents for evaluation of headaches and neck pain.  He states it began yesterday.  He has also been having some episodes of blurred vision.  He reports that he had an episode of the blurred vision a few months ago then once last night and again today.  He states the episodes of visual change lasts about 15-20 minutes and then he gets the headache and neck stiffness.  States it seems Simpson affect one side, but not sure which side. Vision is back Simpson normal now but has moderate intensity headache across both sides of the top of his head.  Last night he also noted his blood pressure was elevated.  However, he does admit he missed all his meds yesterday am, but did take them this morning.   He did have what was though Simpson be TIA last spring and had MRA showing marked stenosis of right vertebral region.  Has since been taking aspirin and blood pressure medications. He was also prescribed rosuvastatin last year, but only started taking it in December.    Medications: Outpatient Medications Prior Simpson Visit  Medication Sig   amLODipine (NORVASC) 5 MG tablet TAKE ONE TABLET BY MOUTH ONE TIME DAILY   aspirin 81 MG tablet Take 81 mg by mouth daily.   hydrochlorothiazide (HYDRODIURIL) 12.5 MG tablet Take 1 tablet (12.5 mg total) by mouth daily.   levothyroxine (SYNTHROID) 137 MCG tablet Take 0.5 tablets by mouth daily before breakfast.   rosuvastatin (CRESTOR) 5 MG tablet Take 1 tablet (5 mg total) by mouth daily.   No facility-administered medications prior Simpson visit.    Review of Systems  Constitutional:  Positive for fatigue. Negative for fever.  Eyes:  Positive for visual disturbance.  Musculoskeletal:  Positive for neck stiffness.  Negative for arthralgias, back pain and neck pain.  Neurological:  Positive for dizziness, light-headedness, numbness (lip) and headaches. Negative for tremors, seizures, syncope and speech difficulty.       Objective    BP (!) 150/78 (BP Location: Right Arm, Patient Position: Sitting, Cuff Size: Large)   Pulse (!) 54   Temp 98.1 F (36.7 C) (Oral)   Wt 217 lb (98.4 kg)   SpO2 97%   BMI 32.99 kg/m    Physical Exam   General: Appearance:    Overweight male in no acute distress  Eyes:    PERRL, conjunctiva/corneas clear, EOM's intact. All visual fields intact.   Lungs:     Clear Simpson auscultation bilaterally, respirations unlabored  Heart:    Bradycardic. Normal rhythm. No murmurs, rubs, or gallops.    MS:   All extremities are intact.  Tender  SCM and upper trapezius at limits of head rotation, otherwise FROM of neck.   Neurologic:   Awake, alert, oriented x 3. No apparent focal neurological defect. CN 2-12 grossly intact.         Assessment & Plan     1. Visual changes  2. Acute intractable headache, unspecified headache type  3. Vertebral artery stenosis, right  - MR Angiogram Neck W Wo Contrast; Future - MR Angiogram Head Wo Contrast; Future  4. Primary hypertension Uncontrolled, double amLODipine (NORVASC) 5 MG tablet  Simpson take 2 tablets (10 mg total) by mouth daily.  5. TIA (transient ischemic attack) Counseled on importance of strict blood pressure and cholesterol management.   Continue ECASA, consider adding second antiplatelet medication if sx persist after MRA is reviewed.   The entirety of the information documented in the History of Present Illness, Review of Systems and Physical Exam were personally obtained by me. Portions of this information were initially documented by the CMA and reviewed by me for thoroughness and accuracy.     Lelon Huh, MD  [facility] [phone] (phone) [phone] (fax)  [place]

## 2023-01-07 ENCOUNTER — Other Ambulatory Visit: Payer: Self-pay | Admitting: Family Medicine

## 2023-01-07 DIAGNOSIS — H539 Unspecified visual disturbance: Secondary | ICD-10-CM

## 2023-01-07 DIAGNOSIS — G459 Transient cerebral ischemic attack, unspecified: Secondary | ICD-10-CM

## 2023-01-07 DIAGNOSIS — I6501 Occlusion and stenosis of right vertebral artery: Secondary | ICD-10-CM

## 2023-01-07 MED ORDER — CLOPIDOGREL BISULFATE 75 MG PO TABS: 75.0000 mg | ORAL_TABLET | Freq: Every day | ORAL | 1 refills | Status: DC

## 2023-01-14 ENCOUNTER — Other Ambulatory Visit: Payer: Self-pay

## 2023-01-14 ENCOUNTER — Telehealth: Payer: Self-pay

## 2023-01-14 DIAGNOSIS — N200 Calculus of kidney: Secondary | ICD-10-CM

## 2023-01-14 DIAGNOSIS — R310 Gross hematuria: Secondary | ICD-10-CM

## 2023-01-14 NOTE — Telephone Encounter (Signed)
Lmtcb to schedule fu appt. PEC please schedule.

## 2023-01-14 NOTE — Telephone Encounter (Signed)
Patient called and advised per notes Dr. Caryn Section below, he verbalized understanding. Same Day appointment scheduled for Monday 01/26/23 at 0920.

## 2023-01-14 NOTE — Telephone Encounter (Signed)
-----   Message from Birdie Sons, MD sent at 01/07/2023  7:50 AM EST ----- MRA  is basically unchanged from last year. I'm sending in a prescription for clopidogrel which is another blood thinner to take in addition to aspirin to prevent strokes. Am placing a referral to neurology for further evaluation. Need to follow up in 7-10 days to check on blood pressure since we increased the dose of amlodipine. Can have same day slot.

## 2023-01-15 ENCOUNTER — Ambulatory Visit
Admission: RE | Admit: 2023-01-15 | Discharge: 2023-01-15 | Disposition: A | Discharge status: Home / Self Care | Payer: Medicare HMO | Source: Ambulatory Visit | Attending: Urology | Admitting: Urology

## 2023-01-15 ENCOUNTER — Encounter: Payer: Self-pay | Admitting: Urology

## 2023-01-15 ENCOUNTER — Ambulatory Visit: Payer: Medicare HMO | Admitting: Urology

## 2023-01-15 VITALS — BP 148/83 | HR 58 | Ht 68.0 in | Wt 213.0 lb

## 2023-01-15 DIAGNOSIS — R310 Gross hematuria: Secondary | ICD-10-CM

## 2023-01-15 DIAGNOSIS — N200 Calculus of kidney: Secondary | ICD-10-CM | POA: Diagnosis not present

## 2023-01-15 DIAGNOSIS — Z125 Encounter for screening for malignant neoplasm of prostate: Secondary | ICD-10-CM | POA: Diagnosis not present

## 2023-01-15 DIAGNOSIS — Z87898 Personal history of other specified conditions: Secondary | ICD-10-CM

## 2023-01-15 DIAGNOSIS — R319 Hematuria, unspecified: Secondary | ICD-10-CM | POA: Diagnosis not present

## 2023-01-15 LAB — URINALYSIS, COMPLETE
Bilirubin, UA: NEGATIVE
Glucose, UA: NEGATIVE
Ketones, UA: NEGATIVE
Leukocytes,UA: NEGATIVE
Nitrite, UA: NEGATIVE
Protein,UA: NEGATIVE
Specific Gravity, UA: 1.025 (ref 1.005–1.030)
Urobilinogen, Ur: 0.2 mg/dL (ref 0.2–1.0)
pH, UA: 5 (ref 5.0–7.5)

## 2023-01-15 LAB — MICROSCOPIC EXAMINATION

## 2023-01-15 NOTE — Progress Notes (Signed)
   01/15/2023 1:18 PM   Reginald Simpson [birth year] [id number]  Referring provider: Birdie Sons, MD [address] [place],  [place] [address]  Chief Complaint  Patient presents with   Follow-up    Urologic history: 1.  Gross hematuria  December 2022 CTU nonobstructing 3 mm left lower pole calculus Cystoscopy with moderate lateral lobe enlargement and hypervascularity  2.  Left nephrolithiasis As above   HPI: 67 y.o. male presents for annual follow-up.  Doing well since last visit Urinary frequency but not bothersome enough that he desires medical management Denies recurrent gross hematuria Denies flank, abdominal or pelvic pain Last PSA 12/23/2021.  Not scheduled for a follow-up PSA with PCP   PMH: Past Medical History:  Diagnosis Date   Colon polyps    Hyperglycemia     Surgical History: Past Surgical History:  Procedure Laterality Date   BACK SURGERY  1994   Ruptured lumbar disk   COLONOSCOPY  07/07/2013   Mild active colitis of Cecum, Hyperplastic polyp in rectum. [facility]   SHOULDER ARTHROSCOPY Right 03/2017   Dr. Theda Sers,, [place] ARTHROSCOPY Left 03/2019    Home Medications:  Allergies as of 01/15/2023   No Known Allergies      Medication List        Accurate as of January 15, 2023  1:18 PM. If you have any questions, ask your nurse or doctor.          amLODipine 5 MG tablet Commonly known as: NORVASC Take 2 tablets (10 mg total) by mouth daily.   aspirin 81 MG tablet Take 81 mg by mouth daily.   clopidogrel 75 MG tablet Commonly known as: PLAVIX Take 1 tablet (75 mg total) by mouth daily.   hydrochlorothiazide 12.5 MG tablet Commonly known as: HYDRODIURIL Take 1 tablet (12.5 mg total) by mouth daily.   levothyroxine 137 MCG tablet Commonly known as: SYNTHROID Take 0.5 tablets by mouth daily before breakfast.   rosuvastatin 5 MG tablet Commonly known as:  Crestor Take 1 tablet (5 mg total) by mouth daily.        Allergies: No Known Allergies  Family History: Family History  Problem Relation Age of Onset   Hypertension Mother    Prostate cancer Father 29    Social History:  reports that he has never smoked. He has never used smokeless tobacco. He reports current alcohol use. He reports that he does not use drugs.   Physical Exam: BP (!) 148/83   Pulse (!) 58   Ht 5' 8"$  (1.727 m)   Wt 213 lb (96.6 kg)   BMI 32.39 kg/m   Constitutional:  Alert and oriented, No acute distress. HEENT: [place] AT Respiratory: Normal respiratory effort, no increased work of breathing. Psychiatric: Normal mood and affect.  Pertinent Imaging: Images of a KUB performed today were personally reviewed and interpreted.  There is a faint 2-3 mm calcification overlying the lower portion of the left renal outline   Assessment & Plan:    1.  History gross hematuria No recurrent episodes UA ordered  2.  Left nephrolithiasis Stable  3.  Prostate cancer screening Requested PSA today  He will follow-up prn   Reginald Simpson, [place] [address] [place], [place] [address] [phone]

## 2023-01-16 ENCOUNTER — Encounter: Payer: Self-pay | Admitting: Urology

## 2023-01-16 LAB — PSA: Prostate Specific Ag, Serum: 0.7 ng/mL (ref 0.0–4.0)

## 2023-01-26 ENCOUNTER — Ambulatory Visit (INDEPENDENT_AMBULATORY_CARE_PROVIDER_SITE_OTHER): Payer: Medicare HMO | Admitting: Family Medicine

## 2023-01-26 ENCOUNTER — Encounter: Payer: Self-pay | Admitting: Family Medicine

## 2023-01-26 VITALS — BP 138/79 | HR 56 | Ht 68.0 in | Wt 217.0 lb

## 2023-01-26 DIAGNOSIS — E039 Hypothyroidism, unspecified: Secondary | ICD-10-CM | POA: Diagnosis not present

## 2023-01-26 DIAGNOSIS — R7303 Prediabetes: Secondary | ICD-10-CM | POA: Diagnosis not present

## 2023-01-26 DIAGNOSIS — H9313 Tinnitus, bilateral: Secondary | ICD-10-CM

## 2023-01-26 DIAGNOSIS — R42 Dizziness and giddiness: Secondary | ICD-10-CM | POA: Diagnosis not present

## 2023-01-26 DIAGNOSIS — E782 Mixed hyperlipidemia: Secondary | ICD-10-CM

## 2023-01-26 DIAGNOSIS — I10 Essential (primary) hypertension: Secondary | ICD-10-CM | POA: Diagnosis not present

## 2023-01-26 DIAGNOSIS — I6501 Occlusion and stenosis of right vertebral artery: Secondary | ICD-10-CM

## 2023-01-26 NOTE — Progress Notes (Addendum)
Established patient visit   Patient: Reginald Simpson   DOB: 30-Nov-1955   67 y.o. Male  MRN: [id number] Visit Date: 01/26/2023  Today's healthcare provider: Lelon Huh, MD   Chief Complaint  Patient presents with   Osteoarthritis   Subjective    HPI HPI   2 WEEKS F/U--pt stated--dizzy especially when walking out of balance, and getting up out of the bed. Both ringing on both ears. Last edited by Elta Guadeloupe, CMA on 01/26/2023  9:37 AM.      BP Readings from Last 3 Encounters:  01/26/23 138/79  01/15/23 (!) 148/83  01/06/23 (!) 150/78   Since last visit 2 weeks ago amlodipine was doubled to '10mg'$  and has had MRA of neck and head with findings c/w right vertebral artery stenosis stable from study done last year. Was also started on clopidogrel in addition to '81mg'$  ECASA that he was already taking due to visual disturbances which have since resolved. He has appointment scheduled with Dr. Melrose Nakayama later this week. He states home BP c/w with todays reading.   He states that dizziness starts in the mornings when he first gets up out of bed, improves after laying down for awhile, occurs a few times per week. Episode 2 mornings ago associated with nausea and vomiting. No spinning sensation. Has had light episodes in the past associated with standing or sitting up quickly, but previously not as severe.    Medications: Outpatient Medications Prior to Visit  Medication Sig   amLODipine (NORVASC) 5 MG tablet Take 2 tablets (10 mg total) by mouth daily.   aspirin 81 MG tablet Take 81 mg by mouth daily.   clopidogrel (PLAVIX) 75 MG tablet Take 1 tablet (75 mg total) by mouth daily.   hydrochlorothiazide (HYDRODIURIL) 12.5 MG tablet Take 1 tablet (12.5 mg total) by mouth daily.   levothyroxine (SYNTHROID) 137 MCG tablet Take 1 tablet by mouth daily before breakfast.   rosuvastatin (CRESTOR) 5 MG tablet Take 1 tablet (5 mg total) by mouth daily.   No facility-administered medications  prior to visit.    Review of Systems     Objective    BP 138/79 (BP Location: Right Arm, Patient Position: Sitting, Cuff Size: Large)   Pulse (!) 56   Ht '5\' 8"'$  (1.727 m)   Wt 217 lb (98.4 kg)   SpO2 97%   BMI 32.99 kg/m    Physical Exam   General: Appearance:    Obese male in no acute distress  Eyes:    PERRL, conjunctiva/corneas clear, EOM's intact       Lungs:     Clear to auscultation bilaterally, respirations unlabored  Heart:    Bradycardic. Normal rhythm. No murmurs, rubs, or gallops.    MS:   All extremities are intact.    Neurologic:   Awake, alert, oriented x 3. No apparent focal neurological defect.          Assessment & Plan     1. Primary hypertension Much better since doubling dose of amlodipine. Advised to call to have prescription changed to '10mg'$  tablets before he runs out of the '5mg'$  tabs.   2. Dizzy Not c/w vertigo. Maybe orthostatic component. Maybe related to vertebrala artery stenosis as below.   3. Vertebral artery stenosis, right See recent follow up MRA. Has appt with Dr. Melrose Nakayama later this week. Continue DAPT  4. Hypothyroidism, unspecified type  - T4, free - TSH  5. Prediabetes  - Hemoglobin A1c  6. Mixed hyperlipidemia He is tolerating rosuvastatin well with no adverse effects.   - CBC - Comprehensive metabolic panel - Lipid panel  7. Tinnitus of both ears Longstanding. No significant change from baseline.         Lelon Huh, MD  [place] [phone] (phone) [phone] (fax)  [place]

## 2023-01-27 LAB — TSH: TSH: 3.09 u[IU]/mL (ref 0.450–4.500)

## 2023-01-27 LAB — COMPREHENSIVE METABOLIC PANEL
ALT: 36 IU/L (ref 0–44)
AST: 28 IU/L (ref 0–40)
Albumin/Globulin Ratio: 2.1 (ref 1.2–2.2)
Albumin: 4.1 g/dL (ref 3.9–4.9)
Alkaline Phosphatase: 75 IU/L (ref 44–121)
BUN/Creatinine Ratio: 20 (ref 10–24)
BUN: 16 mg/dL (ref 8–27)
Bilirubin Total: 0.6 mg/dL (ref 0.0–1.2)
CO2: 24 mmol/L (ref 20–29)
Calcium: 8.9 mg/dL (ref 8.6–10.2)
Chloride: 103 mmol/L (ref 96–106)
Creatinine, Ser: 0.82 mg/dL (ref 0.76–1.27)
Globulin, Total: 2 g/dL (ref 1.5–4.5)
Glucose: 122 mg/dL — ABNORMAL HIGH (ref 70–99)
Potassium: 4.3 mmol/L (ref 3.5–5.2)
Sodium: 140 mmol/L (ref 134–144)
Total Protein: 6.1 g/dL (ref 6.0–8.5)
eGFR: 97 mL/min/{1.73_m2} (ref >59)

## 2023-01-27 LAB — CBC
Hematocrit: 45.6 % (ref 37.5–51.0)
Hemoglobin: 16.2 g/dL (ref 13.0–17.7)
MCH: 31.7 pg (ref 26.6–33.0)
MCHC: 35.5 g/dL (ref 31.5–35.7)
MCV: 89 fL (ref 79–97)
Platelets: 273 10*3/uL (ref 150–450)
RBC: 5.11 x10E6/uL (ref 4.14–5.80)
RDW: 11.6 % (ref 11.6–15.4)
WBC: 4.3 10*3/uL (ref 3.4–10.8)

## 2023-01-27 LAB — LIPID PANEL
Chol/HDL Ratio: 2.9 ratio (ref 0.0–5.0)
Cholesterol, Total: 151 mg/dL (ref 100–199)
HDL: 52 mg/dL (ref >39)
LDL Chol Calc (NIH): 79 mg/dL (ref 0–99)
Triglycerides: 108 mg/dL (ref 0–149)
VLDL Cholesterol Cal: 20 mg/dL (ref 5–40)

## 2023-01-27 LAB — T4, FREE: Free T4: 1.43 ng/dL (ref 0.82–1.77)

## 2023-01-27 LAB — HEMOGLOBIN A1C
Est. average glucose Bld gHb Est-mCnc: 131 mg/dL
Hgb A1c MFr Bld: 6.2 % — ABNORMAL HIGH (ref 4.8–5.6)

## 2023-01-29 DIAGNOSIS — H539 Unspecified visual disturbance: Secondary | ICD-10-CM | POA: Diagnosis not present

## 2023-01-29 DIAGNOSIS — R413 Other amnesia: Secondary | ICD-10-CM | POA: Diagnosis not present

## 2023-01-29 DIAGNOSIS — R42 Dizziness and giddiness: Secondary | ICD-10-CM | POA: Diagnosis not present

## 2023-01-29 DIAGNOSIS — R519 Headache, unspecified: Secondary | ICD-10-CM | POA: Diagnosis not present

## 2023-01-30 ENCOUNTER — Other Ambulatory Visit: Payer: Self-pay | Admitting: Family Medicine

## 2023-01-30 DIAGNOSIS — G459 Transient cerebral ischemic attack, unspecified: Secondary | ICD-10-CM

## 2023-02-01 ENCOUNTER — Other Ambulatory Visit: Payer: Self-pay | Admitting: Family Medicine

## 2023-02-06 ENCOUNTER — Other Ambulatory Visit: Payer: Self-pay | Admitting: Neurology

## 2023-02-06 ENCOUNTER — Encounter: Payer: Self-pay | Admitting: Neurology

## 2023-02-06 DIAGNOSIS — G3184 Mild cognitive impairment, so stated: Secondary | ICD-10-CM

## 2023-02-06 DIAGNOSIS — R42 Dizziness and giddiness: Secondary | ICD-10-CM

## 2023-02-06 DIAGNOSIS — H539 Unspecified visual disturbance: Secondary | ICD-10-CM

## 2023-02-06 DIAGNOSIS — R519 Headache, unspecified: Secondary | ICD-10-CM

## 2023-02-09 ENCOUNTER — Encounter: Payer: Self-pay | Admitting: Neurology

## 2023-02-17 ENCOUNTER — Other Ambulatory Visit: Payer: Self-pay | Admitting: Family Medicine

## 2023-02-17 DIAGNOSIS — Z1211 Encounter for screening for malignant neoplasm of colon: Secondary | ICD-10-CM

## 2023-02-19 ENCOUNTER — Ambulatory Visit
Admission: RE | Admit: 2023-02-19 | Discharge: 2023-02-19 | Disposition: A | Discharge status: Home / Self Care | Payer: Medicare HMO | Source: Ambulatory Visit | Attending: Neurology | Admitting: Neurology

## 2023-02-19 DIAGNOSIS — H539 Unspecified visual disturbance: Secondary | ICD-10-CM

## 2023-02-19 DIAGNOSIS — I672 Cerebral atherosclerosis: Secondary | ICD-10-CM | POA: Diagnosis not present

## 2023-02-19 DIAGNOSIS — R42 Dizziness and giddiness: Secondary | ICD-10-CM

## 2023-02-19 DIAGNOSIS — M47812 Spondylosis without myelopathy or radiculopathy, cervical region: Secondary | ICD-10-CM | POA: Diagnosis not present

## 2023-02-19 DIAGNOSIS — G3184 Mild cognitive impairment, so stated: Secondary | ICD-10-CM

## 2023-02-19 DIAGNOSIS — R519 Headache, unspecified: Secondary | ICD-10-CM

## 2023-02-19 MED ORDER — IOPAMIDOL (ISOVUE-370) INJECTION 76%
75.0000 mL | Freq: Once | INTRAVENOUS | Status: AC | PRN
  Administered 2023-02-19: 75 mL via INTRAVENOUS

## 2023-02-23 DIAGNOSIS — Z1211 Encounter for screening for malignant neoplasm of colon: Secondary | ICD-10-CM | POA: Diagnosis not present

## 2023-02-27 ENCOUNTER — Other Ambulatory Visit: Payer: Self-pay | Admitting: Family Medicine

## 2023-02-27 DIAGNOSIS — I10 Essential (primary) hypertension: Secondary | ICD-10-CM

## 2023-03-02 DIAGNOSIS — G3184 Mild cognitive impairment, so stated: Secondary | ICD-10-CM | POA: Diagnosis not present

## 2023-03-02 DIAGNOSIS — R519 Headache, unspecified: Secondary | ICD-10-CM | POA: Diagnosis not present

## 2023-03-02 DIAGNOSIS — R42 Dizziness and giddiness: Secondary | ICD-10-CM | POA: Diagnosis not present

## 2023-03-02 DIAGNOSIS — H539 Unspecified visual disturbance: Secondary | ICD-10-CM | POA: Diagnosis not present

## 2023-03-03 LAB — COLOGUARD: COLOGUARD: NEGATIVE

## 2023-03-06 ENCOUNTER — Other Ambulatory Visit: Payer: Self-pay

## 2023-03-06 ENCOUNTER — Encounter: Payer: Self-pay | Admitting: Family Medicine

## 2023-03-06 DIAGNOSIS — I10 Essential (primary) hypertension: Secondary | ICD-10-CM

## 2023-03-06 MED ORDER — AMLODIPINE BESYLATE 10 MG PO TABS: 10.0000 mg | ORAL_TABLET | Freq: Every day | ORAL | 1 refills | Status: DC

## 2023-04-27 ENCOUNTER — Ambulatory Visit (INDEPENDENT_AMBULATORY_CARE_PROVIDER_SITE_OTHER): Payer: Medicare HMO | Admitting: Family Medicine

## 2023-04-27 VITALS — BP 142/79 | HR 58 | Temp 97.7°F | Resp 13 | Ht 68.0 in | Wt 224.6 lb

## 2023-04-27 DIAGNOSIS — E039 Hypothyroidism, unspecified: Secondary | ICD-10-CM | POA: Diagnosis not present

## 2023-04-27 DIAGNOSIS — M1711 Unilateral primary osteoarthritis, right knee: Secondary | ICD-10-CM

## 2023-04-27 DIAGNOSIS — I10 Essential (primary) hypertension: Secondary | ICD-10-CM

## 2023-04-27 DIAGNOSIS — M7061 Trochanteric bursitis, right hip: Secondary | ICD-10-CM

## 2023-04-27 MED ORDER — LEVOTHYROXINE SODIUM 137 MCG PO TABS: 137.0000 ug | ORAL_TABLET | Freq: Every day | ORAL | 3 refills | Status: DC

## 2023-04-27 NOTE — Progress Notes (Signed)
I,Vanessa  Vital,acting as a Neurosurgeon for [name], MD.,have documented all relevant documentation on the behalf of [name], MD,as directed by  [name], MD    Established patient visit   Patient: Reginald Simpson   DOB: Aug 02, 1956   67 y.o. Male  MRN: [id number] Visit Date: 04/27/2023  Today's healthcare provider: [name], MD   Chief Complaint  Patient presents with   Medication Management   Subjective     Discussed the use of AI scribe software for clinical note transcription with the patient, who gave verbal consent to proceed.  History of Present Illness   The patient, with a history of hypothyroidism, presents with fatigue and fluid buildup in his ankles. He has been feeling tired for several months, and the fluid buildup is a more recent development. He is currently taking levothyroxine, but there is a discrepancy between the dosage on the pill bottle (25 mcg) and the dosage he believes he should be taking (137 mcg). The patient suspects he may have been taking the wrong dosage for a while, as the pill bottle is nearly empty.  In addition to the thyroid-related issues, the patient has been taking meloxicam for knee and hip pain, prescribed by Dr. Audelia Acton. The medication has been helping, and the patient experiences discomfort when he stops taking it for more than a few days. He has been advised to check with his GP.        Medications: Outpatient Medications Prior to Visit  Medication Sig   amLODipine (NORVASC) 10 MG tablet Take 1 tablet (10 mg total) by mouth daily.   aspirin 81 MG tablet Take 81 mg by mouth daily.   clopidogrel (PLAVIX) 75 MG tablet TAKE 1 TABLET BY MOUTH EVERY DAY   hydrochlorothiazide (HYDRODIURIL) 12.5 MG tablet Take 1 tablet (12.5 mg total) by mouth daily.   hydrochlorothiazide (MICROZIDE) 12.5 MG capsule TAKE ONE CAPSULE BY MOUTH ONE TIME DAILY   levothyroxine (SYNTHROID) 137 MCG tablet Take 1 tablet by mouth daily before  breakfast.   rosuvastatin (CRESTOR) 5 MG tablet Take 1 tablet (5 mg total) by mouth daily.   No facility-administered medications prior to visit.    Review of Systems  Constitutional:  Positive for fatigue. Negative for appetite change, chills and fever.  Respiratory:  Negative for chest tightness, shortness of breath and wheezing.   Cardiovascular:  Negative for chest pain and palpitations.  Gastrointestinal:  Negative for abdominal pain, nausea and vomiting.       Objective    BP (!) 142/79 (BP Location: Right Arm, Patient Position: Sitting, Cuff Size: Normal)   Pulse (!) 58   Temp 97.7 F (36.5 C) (Oral)   Resp 13   Ht 5\' 8"  (1.727 m)   Wt 224 lb 9.6 oz (101.9 kg)   SpO2 97%   BMI 34.15 kg/m    Physical Exam  General appearance: Obese male, cooperative and in no acute distress Head: Normocephalic, without obvious abnormality, atraumatic Respiratory: Respirations even and unlabored, normal respiratory rate Extremities: All extremities are intact.  Skin: Skin color, texture, turgor normal. No rashes seen  Psych: Appropriate mood and affect. Neurologic: Mental status: Alert, oriented to person, place, and time, thought content appropriate.   Assessment & Plan     Assessment and Plan    Hypothyroidism: Patient reports fatigue and peripheral edema. Likely due to under-dosing of Levothyroxine ( instead of ), which was apparently dispensed in error in February when prescription was transferred from  [facility] to [facility]. Last TSH was normal in March. -Resend prescription for Levothyroxine daily to [facility].  Osteoarthritis: Patient reports relief with Meloxicam prescribed by Dr. Audelia Acton. No adverse effects noted. -Continue Meloxicam as needed, with a goal of minimizing use to 3-4 days per week if possible. -Plan to monitor kidney function and blood pressure due to potential effects of Meloxicam.  Follow-up in September 2024 to reassess symptoms and monitor  kidney function, thryoid and blood pressure.          The entirety of the information documented in the History of Present Illness, Review of Systems and Physical Exam were personally obtained by me. Portions of this information were initially documented by the CMA and reviewed by me for thoroughness and accuracy.     [name], MD  [facility] [phone] (phone) [phone] (fax)  [facility]

## 2023-04-27 NOTE — Patient Instructions (Signed)
.   Please review the attached list of medications and notify my office if there are any errors.   . Please bring all of your medications to every appointment so we can make sure that our medication list is the same as yours.   

## 2023-05-06 ENCOUNTER — Other Ambulatory Visit: Payer: Self-pay | Admitting: Family Medicine

## 2023-05-06 DIAGNOSIS — E785 Hyperlipidemia, unspecified: Secondary | ICD-10-CM

## 2023-05-06 DIAGNOSIS — I10 Essential (primary) hypertension: Secondary | ICD-10-CM

## 2023-05-06 NOTE — Telephone Encounter (Signed)
Requested Prescriptions  Pending Prescriptions Disp Refills   rosuvastatin (CRESTOR) 5 MG tablet [Pharmacy Med Name: ROSUVASTATIN 5 MG TAB] 90 tablet 2    Sig: TAKE ONE TABLET BY MOUTH ONE TIME DAILY     Cardiovascular:  Antilipid - Statins 2 Failed - 05/06/2023 12:18 PM      Failed - Lipid Panel in normal range within the last 12 months    Cholesterol, Total  Date Value Ref Range Status  01/26/2023 151 100 - 199 mg/dL Final   LDL Chol Calc (NIH)  Date Value Ref Range Status  01/26/2023 79 0 - 99 mg/dL Final   HDL  Date Value Ref Range Status  01/26/2023 52 >39 mg/dL Final   Triglycerides  Date Value Ref Range Status  01/26/2023 108 0 - 149 mg/dL Final         Passed - Cr in normal range and within 360 days    Creatinine, Ser  Date Value Ref Range Status  01/26/2023 0.82 0.76 - 1.27 mg/dL Final         Passed - Patient is not pregnant      Passed - Valid encounter within last 12 months    Recent Outpatient Visits           1 week ago Primary hypertension   [place] [facility] Malva Limes, MD   3 months ago Primary hypertension   [place] [facility] Malva Limes, MD   4 months ago Visual changes   [place] [facility] Malva Limes, MD   6 months ago Encounter for initial preventive physical examination covered by [facility]   [facility] Malva Limes, MD   1 year ago Primary hypertension   [place] [facility] Malva Limes, MD       Future Appointments             In 2 months Fisher, Demetrios Isaacs, MD [facility]

## 2023-05-27 ENCOUNTER — Ambulatory Visit: Payer: Medicare HMO | Admitting: Family Medicine

## 2023-07-01 ENCOUNTER — Encounter: Payer: Self-pay | Admitting: Family Medicine

## 2023-07-28 ENCOUNTER — Ambulatory Visit (INDEPENDENT_AMBULATORY_CARE_PROVIDER_SITE_OTHER): Payer: Medicare HMO | Admitting: Family Medicine

## 2023-07-28 ENCOUNTER — Encounter: Payer: Self-pay | Admitting: Family Medicine

## 2023-07-28 VITALS — BP 134/84 | HR 52 | Ht 68.0 in | Wt 205.1 lb

## 2023-07-28 DIAGNOSIS — E039 Hypothyroidism, unspecified: Secondary | ICD-10-CM

## 2023-07-28 DIAGNOSIS — R7303 Prediabetes: Secondary | ICD-10-CM

## 2023-07-28 DIAGNOSIS — E782 Mixed hyperlipidemia: Secondary | ICD-10-CM

## 2023-07-28 DIAGNOSIS — I10 Essential (primary) hypertension: Secondary | ICD-10-CM | POA: Diagnosis not present

## 2023-07-29 LAB — LIPID PANEL
Chol/HDL Ratio: 2.4 ratio (ref 0.0–5.0)
Cholesterol, Total: 119 mg/dL (ref 100–199)
HDL: 50 mg/dL (ref >39)
LDL Chol Calc (NIH): 50 mg/dL (ref 0–99)
Triglycerides: 101 mg/dL (ref 0–149)
VLDL Cholesterol Cal: 19 mg/dL (ref 5–40)

## 2023-07-29 LAB — T4, FREE: Free T4: 1.8 ng/dL — ABNORMAL HIGH (ref 0.82–1.77)

## 2023-07-29 LAB — TSH: TSH: 2.6 u[IU]/mL (ref 0.450–4.500)

## 2023-07-29 LAB — HEMOGLOBIN A1C
Est. average glucose Bld gHb Est-mCnc: 120 mg/dL
Hgb A1c MFr Bld: 5.8 % — ABNORMAL HIGH (ref 4.8–5.6)

## 2023-07-29 NOTE — Progress Notes (Signed)
      Established patient visit   Patient: Reginald Simpson   DOB: 1956-08-12   67 y.o. Male  MRN: [id number] Visit Date: 07/28/2023  Today's healthcare provider: [name], MD   Chief Complaint  Patient presents with   Medical Management of Chronic Issues    Follow up on BP, no concerns today    Subjective     HPI     Medical Management of Chronic Issues    Additional comments: Follow up on BP, no concerns today       Last edited by Rolly Salter, CMA on 07/28/2023  9:49 AM.      [name] well, tolerating current medications. Also due to check thyroid and lipids. He has started on diet and wellness planning and strictly sticking to healthy diet and exercising much more consistently.   Medications: Outpatient Medications Prior to Visit  Medication Sig   amLODipine (NORVASC) 10 MG tablet Take 1 tablet (10 mg total) by mouth daily.   aspirin 81 MG tablet Take 81 mg by mouth daily.   clopidogrel (PLAVIX) 75 MG tablet TAKE 1 TABLET BY MOUTH EVERY DAY   hydrochlorothiazide (MICROZIDE) 12.5 MG capsule TAKE ONE CAPSULE BY MOUTH ONE TIME DAILY   levothyroxine (SYNTHROID) 137 MCG tablet Take 1 tablet (137 mcg total) by mouth daily before breakfast.   meloxicam (MOBIC) 15 MG tablet Take 15 mg by mouth daily.   rosuvastatin (CRESTOR) 5 MG tablet TAKE ONE TABLET BY MOUTH ONE TIME DAILY   [DISCONTINUED] hydrochlorothiazide (HYDRODIURIL) 12.5 MG tablet Take 1 tablet (12.5 mg total) by mouth daily. (Patient not taking: Reported on 07/28/2023)   No facility-administered medications prior to visit.    Review of Systems  Constitutional:  Negative for appetite change, chills and fever.  Respiratory:  Negative for chest tightness, shortness of breath and wheezing.   Cardiovascular:  Negative for chest pain and palpitations.  Gastrointestinal:  Negative for abdominal pain, nausea and vomiting.       Objective    BP 134/84 (BP Location: Left Arm, Patient Position: Sitting, Cuff Size:  Normal)   Pulse (!) 52   Ht 5\' 8"  (1.727 m)   Wt 205 lb 1.6 oz (93 kg)   SpO2 97%   BMI 31.19 kg/m    Physical Exam  General appearance: Mildly obese male, cooperative and in no acute distress Head: Normocephalic, without obvious abnormality, atraumatic Respiratory: Respirations even and unlabored, normal respiratory rate Extremities: All extremities are intact.  Skin: Skin color, texture, turgor normal. No rashes seen  Psych: Appropriate mood and affect. Neurologic: Mental status: Alert, oriented to person, place, and time, thought content appropriate.     Assessment & Plan     1. Primary hypertension Well controlled.  Continue current medications.  Working on losing weight and hopes to reduce medications if BP improves with weight loss and exercise.   2. Hypothyroidism, unspecified type Appears clinically euthyroid.  - T4, free - TSH  3. Prediabetes  - Hemoglobin A1c  4. Mixed hyperlipidemia He is tolerating rosuvastatin well with no adverse effects.   - Lipid panel         [name], MD  [facility] [phone] (phone) [phone] (fax)  [facility]

## 2023-08-17 ENCOUNTER — Other Ambulatory Visit: Payer: Self-pay | Admitting: Family Medicine

## 2023-08-17 DIAGNOSIS — G459 Transient cerebral ischemic attack, unspecified: Secondary | ICD-10-CM

## 2023-08-18 NOTE — Telephone Encounter (Signed)
Requested medications are due for refill today.  yes  Requested medications are on the active medications list.  yes  Last refill. 01/30/2023 #90 1 rf  Future visit scheduled.   no  Notes to clinic.  Labs are expired.    Requested Prescriptions  Pending Prescriptions Disp Refills   clopidogrel (PLAVIX) 75 MG tablet [Pharmacy Med Name: CLOPIDOGREL 75 MG TAB[*]] 90 tablet 0    Sig: TAKE ONE TABLET BY MOUTH ONE TIME DAILY     Hematology: Antiplatelets - clopidogrel Failed - 08/17/2023 12:27 PM      Failed - HCT in normal range and within 180 days    Hematocrit  Date Value Ref Range Status  01/26/2023 45.6 37.5 - 51.0 % Final         Failed - HGB in normal range and within 180 days    Hemoglobin  Date Value Ref Range Status  01/26/2023 16.2 13.0 - 17.7 g/dL Final         Failed - PLT in normal range and within 180 days    Platelets  Date Value Ref Range Status  01/26/2023 273 150 - 450 x10E3/uL Final         Passed - Cr in normal range and within 360 days    Creatinine, Ser  Date Value Ref Range Status  01/26/2023 0.82 0.76 - 1.27 mg/dL Final         Passed - Valid encounter within last 6 months    Recent Outpatient Visits           3 weeks ago Primary hypertension   [place] [facility] Malva Limes, MD   3 months ago Primary hypertension   [place] [facility] Malva Limes, MD   6 months ago Primary hypertension   [place] [facility] Malva Limes, MD   7 months ago Visual changes    [facility] Malva Limes, MD   10 months ago Encounter for initial preventive physical examination covered by [facility] Malva Limes, MD

## 2023-08-26 ENCOUNTER — Other Ambulatory Visit: Payer: Self-pay | Admitting: Family Medicine

## 2023-08-27 NOTE — Telephone Encounter (Signed)
Requested Prescriptions  Pending Prescriptions Disp Refills   hydrochlorothiazide (MICROZIDE) 12.5 MG capsule [Pharmacy Med Name: HYDROCHLOROTHIAZIDE 12.5 MG CAP] 90 capsule 1    Sig: TAKE ONE CAPSULE BY MOUTH ONE TIME DAILY     Cardiovascular: Diuretics - Thiazide Failed - 08/26/2023  3:36 PM      Failed - Cr in normal range and within 180 days    Creatinine, Ser  Date Value Ref Range Status  01/26/2023 0.82 0.76 - 1.27 mg/dL Final         Failed - K in normal range and within 180 days    Potassium  Date Value Ref Range Status  01/26/2023 4.3 3.5 - 5.2 mmol/L Final         Failed - Na in normal range and within 180 days    Sodium  Date Value Ref Range Status  01/26/2023 140 134 - 144 mmol/L Final         Passed - Last BP in normal range    BP Readings from Last 1 Encounters:  07/28/23 134/84         Passed - Valid encounter within last 6 months    Recent Outpatient Visits           1 month ago Primary hypertension   [place] [facility] Malva Limes, MD   4 months ago Primary hypertension   [place] [facility] Malva Limes, MD   7 months ago Primary hypertension   [place] [facility] Malva Limes, MD   7 months ago Visual changes   [facility] Malva Limes, MD   10 months ago Encounter for initial preventive physical examination covered by [facility] Malva Limes, MD

## 2023-09-14 ENCOUNTER — Other Ambulatory Visit: Payer: Self-pay | Admitting: Family Medicine

## 2023-09-14 DIAGNOSIS — I10 Essential (primary) hypertension: Secondary | ICD-10-CM

## 2024-01-18 ENCOUNTER — Encounter: Payer: Self-pay | Admitting: Family Medicine

## 2024-01-25 ENCOUNTER — Encounter: Payer: Self-pay | Admitting: Family Medicine

## 2024-01-25 ENCOUNTER — Ambulatory Visit (INDEPENDENT_AMBULATORY_CARE_PROVIDER_SITE_OTHER): Payer: Medicare HMO | Admitting: Family Medicine

## 2024-01-25 VITALS — BP 126/68 | HR 57 | Resp 16 | Ht 68.0 in | Wt 207.0 lb

## 2024-01-25 DIAGNOSIS — I10 Essential (primary) hypertension: Secondary | ICD-10-CM

## 2024-01-25 DIAGNOSIS — E039 Hypothyroidism, unspecified: Secondary | ICD-10-CM

## 2024-01-25 DIAGNOSIS — R7303 Prediabetes: Secondary | ICD-10-CM

## 2024-01-25 DIAGNOSIS — Z125 Encounter for screening for malignant neoplasm of prostate: Secondary | ICD-10-CM

## 2024-01-25 DIAGNOSIS — E782 Mixed hyperlipidemia: Secondary | ICD-10-CM

## 2024-01-25 DIAGNOSIS — I6501 Occlusion and stenosis of right vertebral artery: Secondary | ICD-10-CM | POA: Diagnosis not present

## 2024-01-25 DIAGNOSIS — Z0000 Encounter for general adult medical examination without abnormal findings: Secondary | ICD-10-CM

## 2024-01-25 NOTE — Progress Notes (Signed)
 Complete physical exam   Patient: Reginald Simpson   DOB: 12-16-1955   68 y.o. Male  MRN: [id number] Visit Date: 01/25/2024  Today's healthcare provider: [name], MD   Chief Complaint  Patient presents with   Annual Exam    Patient overall feel good. Decline Prevnar vaccine today   Subjective    Discussed the use of AI scribe software for clinical note transcription with the patient, who gave verbal consent to proceed.  History of Present Illness   The patient presents for an annual physical exam.  He is doing well with his current medications for blood pressure and cholesterol. He has not checked his blood pressure at home recently but feels fine and has not noticed any issues with his blood pressure.  He is taking levothyroxine for thyroid management and reports feeling fine with good energy levels. He describes his sleep as adequate, getting about five to six hours per night, although he has never been a great sleeper.  He has lost approximately 20 pounds since his last visit, although he gained some back since Thanksgiving. He stays active by walking and biking, aiming for 10,000 steps a day.  He drinks alcohol, typically one or two glasses of wine per night, and has never been a regular smoker.  He is taking several medications, including amlodipine and hydrochlorothiazide for blood pressure, rosuvastatin for cholesterol, and clopidogrel due to a history of TIAs and vertebral artery stenosis, which causes occasional dizziness when changing positions. No changes in hearing, stomach problems, changes in bowel habits, or blood in the stool. He confirms completing a Cologuard test a year ago. He reports occasional dizziness when lying down and getting back up.     Lab Results  Component Value Date   NA 140 01/26/2023   K 4.3 01/26/2023   CREATININE 0.82 01/26/2023   EGFR 97 01/26/2023   GLUCOSE 122 (H) 01/26/2023   Lab Results  Component Value Date   HGBA1C 5.8 (H)  07/28/2023   Lab Results  Component Value Date   CHOL 119 07/28/2023   HDL 50 07/28/2023   LDLCALC 50 07/28/2023   TRIG 101 07/28/2023   CHOLHDL 2.4 07/28/2023       Past Medical History:  Diagnosis Date   Colon polyps    Hyperglycemia    Past Surgical History:  Procedure Laterality Date   BACK SURGERY  1994   Ruptured lumbar disk   COLONOSCOPY  07/07/2013   Mild active colitis of Cecum, Hyperplastic polyp in rectum. [facility]   SHOULDER ARTHROSCOPY Right 03/2017   Dr. Thomasena Edis,, [facility]   SHOULDER ARTHROSCOPY Left 03/2019   Social History   Socioeconomic History   Marital status: Married    Spouse name: Not on file   Number of children: 3   Years of education: Not on file   Highest education level: Bachelor's degree (e.g., BA, AB, BS)  Occupational History   Occupation: Self employed  Tobacco Use   Smoking status: Never   Smokeless tobacco: Never  Vaping Use   Vaping status: Never Used  Substance and Sexual Activity   Alcohol use: Yes    Alcohol/week: 0.0 standard drinks of alcohol    Comment: 2-3 beers or glasses of wine 3 night each week   Drug use: No   Sexual activity: Not on file  Other Topics Concern   Not on file  Social History Narrative   Not on file   Social Drivers  of Health   Financial Resource Strain: Low Risk  (04/27/2023)   Overall Financial Resource Strain (CARDIA)    Difficulty of Paying Living Expenses: Not hard at all  Food Insecurity: No Food Insecurity (04/27/2023)   Hunger Vital Sign    Worried About Running Out of Food in the Last Year: Never true    Ran Out of Food in the Last Year: Never true  Transportation Needs: No Transportation Needs (04/27/2023)   PRAPARE - Administrator, Civil Service (Medical): No    Lack of Transportation (Non-Medical): No  Physical Activity: Sufficiently Active (04/27/2023)   Exercise Vital Sign    Days of Exercise per Week: 5 days    Minutes of Exercise  per Session: 30 min  Stress: No Stress Concern Present (04/27/2023)   [employer] of Occupational Health - Occupational Stress Questionnaire    Feeling of Stress : Only a little  Social Connections: Socially Integrated (04/27/2023)   Social Connection and Isolation Panel [NHANES]    Frequency of Communication with Friends and Family: More than three times a week    Frequency of Social Gatherings with Friends and Family: More than three times a week    Attends Religious Services: More than 4 times per year    Active Member of Clubs or Organizations: Yes    Attends Engineer, structural: More than 4 times per year    Marital Status: Married  Catering manager Violence: Not on file   Family Status  Relation Name Status   Mother  Alive   Father  Deceased at age 32       Cause of Death: Prostate cancer   Sister  Alive   Brother  Alive   Brother  Alive  No partnership data on file   Family History  Problem Relation Age of Onset   Hypertension Mother    Prostate cancer Father 65   No Known Allergies  Patient Care Team: Malva Limes, MD as PCP - General (Family Medicine) Lonna Cobb, Verna Czech, MD (Urology) Reinaldo Berber, MD as Consulting Physician (Orthopedic Surgery)   Medications: Outpatient Medications Prior to Visit  Medication Sig   amLODipine (NORVASC) 10 MG tablet TAKE ONE TABLET BY MOUTH ONE TIME DAILY   aspirin 81 MG tablet Take 81 mg by mouth daily.   clopidogrel (PLAVIX) 75 MG tablet TAKE ONE TABLET BY MOUTH ONE TIME DAILY   hydrochlorothiazide (MICROZIDE) 12.5 MG capsule TAKE ONE CAPSULE BY MOUTH ONE TIME DAILY   levothyroxine (SYNTHROID) 137 MCG tablet Take 1 tablet (137 mcg total) by mouth daily before breakfast.   meloxicam (MOBIC) 15 MG tablet Take 15 mg by mouth daily.   rosuvastatin (CRESTOR) 5 MG tablet TAKE ONE TABLET BY MOUTH ONE TIME DAILY   No facility-administered medications prior to visit.    Review of Systems  Constitutional:  Negative  for appetite change, chills and fever.  Respiratory:  Negative for chest tightness, shortness of breath and wheezing.   Cardiovascular:  Negative for chest pain and palpitations.  Gastrointestinal:  Negative for abdominal pain, nausea and vomiting.    Objective    BP 126/68 (BP Location: Left Arm, Patient Position: Sitting, Cuff Size: Large)   Pulse (!) 57   Resp 16   Ht 5\' 8"  (1.727 m)   Wt 207 lb (93.9 kg)   SpO2 99%   BMI 31.47 kg/m    Physical Exam  General Appearance:    Mildly obese male. Alert, cooperative, in no  acute distress, appears stated age  Head:    Normocephalic, without obvious abnormality, atraumatic  Eyes:    PERRL, conjunctiva/corneas clear, EOM's intact, fundi    benign, both eyes       Ears:    Normal TM's and external ear canals, both ears. Partially obstructed by cerumen on left.   Nose:   Nares normal, septum midline, mucosa normal, no drainage   or sinus tenderness  Throat:   Lips, mucosa, and tongue normal; teeth and gums normal  Neck:   Supple, symmetrical, trachea midline, no adenopathy;       thyroid:  No enlargement/tenderness/nodules; no carotid   bruit or JVD  Back:     Symmetric, no curvature, ROM normal, no CVA tenderness  Lungs:     Clear to auscultation bilaterally, respirations unlabored  Chest wall:    No tenderness or deformity  Heart:    Bradycardic. Normal rhythm. No murmurs, rubs, or gallops.  S1 and S2 normal  Abdomen:     Soft, non-tender, bowel sounds active all four quadrants,    no masses, no organomegaly  Genitalia:    deferred  Rectal:    deferred  Extremities:   All extremities are intact. No cyanosis or edema  Pulses:   2+ and symmetric all extremities  Skin:   Skin color, texture, turgor normal, no rashes or lesions  Lymph nodes:   Cervical, supraclavicular, and axillary nodes normal  Neurologic:   CNII-XII intact. Normal strength, sensation and reflexes      throughout        Assessment & Plan    Routine Health  Maintenance and Physical Exam  Exercise Activities and Dietary recommendations  Goals   None     Immunization History  Administered Date(s) Administered   Janssen (J&J) SARS-COV-2 Vaccination 03/18/2020   Tdap 01/05/2008, 07/11/2019    Health Maintenance  Topic Date Due   Zoster Vaccines- Shingrix (1 of 2) Never done   Pneumonia Vaccine 20+ Years old (1 of 1 - PCV) Never done   COVID-19 Vaccine (2 - 2024-25 season) 07/19/2023   INFLUENZA VACCINE  02/15/2024 (Originally 06/18/2023)   Medicare Annual Wellness (AWV)  01/24/2025   Fecal DNA (Cologuard)  02/22/2026   DTaP/Tdap/Td (3 - Td or Tdap) 07/10/2029   Hepatitis C Screening  Completed   HPV VACCINES  Aged Out   Colonoscopy  Discontinued    Discussed health benefits of physical activity, and encouraged him to engage in regular exercise appropriate for his age and condition.     Hypertension and Hyperlipidemia Stable on current medications. Patient expressed interest in reducing medication burden. -Check blood work today including electrolytes, cholesterol, and glucose. -Consider reducing antihypertensive and/or statin therapy depending on lab results.  Hypothyroidism Stable on current Levothyroxine dose. No reported symptoms of hypo- or hyperthyroidism. -Continue current Levothyroxine dose.  Eye Irritation Reports of watery and bloodshot eyes. Patient plans to follow up with eye doctor. -Encourage follow-up with eye doctor.  Vaccinations Patient has not received pneumonia or shingles vaccines. Discussed benefits and potential side effects of both vaccines. -Encourage patient to consider receiving vaccines at a future date.  Transient Ischemic Attacks (TIAs) Patient has history of TIAs and is currently on Clopidogrel. Reports occasional dizziness. -Continue Clopidogrel due to risk of stroke from blood clots forming around arterial anomalies. -Consider further evaluation if dizziness persists or worsens.  General  Health Maintenance Patient has lost weight and is maintaining an active lifestyle. Last Cologuard test was a year ago. -  Encourage continuation of healthy lifestyle habits. -Plan for next Cologuard test in 2 years.      He does not like take medications and would like to cut back if possible. Will see how labs and considering reducing dose of BP or statin.   [name], MD  [facility] [phone] (phone) [phone] (fax)  [facility]

## 2024-01-25 NOTE — Patient Instructions (Addendum)
Please review the attached list of medications and notify my office if there are any errors.   I recommend that you get the Prevnar 20 vaccine to protect yourself from certain dangerous strains of pneumonia. You can get Prevnar 20 at your pharmacy, or call our office at [phone] at your earliest convenience to schedule this vaccine.   The CDC recommends two doses of Shingrix (the shingles vaccine) separated by 2 to 6 months for adults age 67 years and older. I recommend checking with your insurance plan regarding coverage for this vaccine.    

## 2024-01-25 NOTE — Progress Notes (Signed)
 Annual Wellness Visit     Patient: Reginald Simpson, Male    DOB: Sep 17, 1956, 68 y.o.   MRN: [id number] Visit Date: 01/25/2024  Today's Provider: [name], MD    Subjective    Reginald Simpson is a 68 y.o. male who presents today for his Annual Wellness Visit.   Medications: Outpatient Medications Prior to Visit  Medication Sig   amLODipine (NORVASC) 10 MG tablet TAKE ONE TABLET BY MOUTH ONE TIME DAILY   aspirin 81 MG tablet Take 81 mg by mouth daily.   clopidogrel (PLAVIX) 75 MG tablet TAKE ONE TABLET BY MOUTH ONE TIME DAILY   hydrochlorothiazide (MICROZIDE) 12.5 MG capsule TAKE ONE CAPSULE BY MOUTH ONE TIME DAILY   levothyroxine (SYNTHROID) 137 MCG tablet Take 1 tablet (137 mcg total) by mouth daily before breakfast.   meloxicam (MOBIC) 15 MG tablet Take 15 mg by mouth daily.   rosuvastatin (CRESTOR) 5 MG tablet TAKE ONE TABLET BY MOUTH ONE TIME DAILY   No facility-administered medications prior to visit.    No Known Allergies  Patient Care Team: Malva Limes, MD as PCP - General (Family Medicine) Riki Altes, MD (Urology) Reinaldo Berber, MD as Consulting Physician (Orthopedic Surgery) [facility], [facility] (Ophthalmology)     Objective     Most recent functional status assessment:    04/27/2023   10:23 AM  In your present state of health, do you have any difficulty performing the following activities:  Hearing? 0  Vision? 1  Difficulty concentrating or making decisions? 0  Walking or climbing stairs? 1  Dressing or bathing? 0  Doing errands, shopping? 0   Most recent fall risk assessment:    01/25/2024    9:31 AM  Fall Risk   Falls in the past year? 0  Number falls in past yr: 0  Injury with Fall? 0  Risk for fall due to : No Fall Risks    Most recent depression screenings:    01/25/2024    9:31 AM 04/27/2023   10:22 AM  PHQ 2/9 Scores  PHQ - 2 Score 0 0  PHQ- 9 Score  4   Most recent cognitive screening:     No data to  display         Most recent Audit-C alcohol use screening    04/27/2023   10:23 AM  Alcohol Use Disorder Test (AUDIT)  1. How often do you have a drink containing alcohol? 3  2. How many drinks containing alcohol do you have on a typical day when you are drinking? 0  3. How often do you have six or more drinks on one occasion? 0  AUDIT-C Score 3   A score of 3 or more in women, and 4 or more in men indicates increased risk for alcohol abuse, EXCEPT if all of the points are from question 1   No results found for any visits on 01/25/24.  Assessment & Plan     Annual wellness visit done today including the all of the following: Reviewed patient's Family Medical History Reviewed and updated list of patient's medical providers Assessment of cognitive impairment was done Assessed patient's functional ability Established a written schedule for health screening services Health Risk Assessent Completed and Reviewed  Exercise Activities and Dietary recommendations  Goals   None     Immunization History  Administered Date(s) Administered   Janssen (J&J) SARS-COV-2 Vaccination 03/18/2020   Tdap 01/05/2008, 07/11/2019  Health Maintenance  Topic Date Due   Zoster Vaccines- Shingrix (1 of 2) Never done   Pneumonia Vaccine 52+ Years old (1 of 1 - PCV) Never done   COVID-19 Vaccine (2 - 2024-25 season) 07/19/2023   INFLUENZA VACCINE  02/15/2024 (Originally 06/18/2023)   Medicare Annual Wellness (AWV)  01/24/2025   Fecal DNA (Cologuard)  02/22/2026   DTaP/Tdap/Td (3 - Td or Tdap) 07/10/2029   Hepatitis C Screening  Completed   HPV VACCINES  Aged Out   Colonoscopy  Discontinued     Discussed health benefits of physical activity, and encouraged him to engage in regular exercise appropriate for his age and condition.           [name], MD  [facility] [phone] (phone) [phone] (fax)  [facility]

## 2024-01-26 ENCOUNTER — Other Ambulatory Visit: Payer: Self-pay | Admitting: Family Medicine

## 2024-01-26 ENCOUNTER — Encounter: Payer: Self-pay | Admitting: Family Medicine

## 2024-01-26 DIAGNOSIS — I10 Essential (primary) hypertension: Secondary | ICD-10-CM

## 2024-01-26 LAB — COMPREHENSIVE METABOLIC PANEL
ALT: 26 IU/L (ref 0–44)
AST: 24 IU/L (ref 0–40)
Albumin: 4.3 g/dL (ref 3.9–4.9)
Alkaline Phosphatase: 102 IU/L (ref 44–121)
BUN/Creatinine Ratio: 24 (ref 10–24)
BUN: 20 mg/dL (ref 8–27)
Bilirubin Total: 0.4 mg/dL (ref 0.0–1.2)
CO2: 26 mmol/L (ref 20–29)
Calcium: 9.3 mg/dL (ref 8.6–10.2)
Chloride: 102 mmol/L (ref 96–106)
Creatinine, Ser: 0.82 mg/dL (ref 0.76–1.27)
Globulin, Total: 2.5 g/dL (ref 1.5–4.5)
Glucose: 118 mg/dL — ABNORMAL HIGH (ref 70–99)
Potassium: 4.9 mmol/L (ref 3.5–5.2)
Sodium: 140 mmol/L (ref 134–144)
Total Protein: 6.8 g/dL (ref 6.0–8.5)
eGFR: 97 mL/min/{1.73_m2} (ref >59)

## 2024-01-26 LAB — CBC
Hematocrit: 50.7 % (ref 37.5–51.0)
Hemoglobin: 16.8 g/dL (ref 13.0–17.7)
MCH: 30.4 pg (ref 26.6–33.0)
MCHC: 33.1 g/dL (ref 31.5–35.7)
MCV: 92 fL (ref 79–97)
Platelets: 269 10*3/uL (ref 150–450)
RBC: 5.53 x10E6/uL (ref 4.14–5.80)
RDW: 12 % (ref 11.6–15.4)
WBC: 5.2 10*3/uL (ref 3.4–10.8)

## 2024-01-26 LAB — PSA TOTAL (REFLEX TO FREE): Prostate Specific Ag, Serum: 1.1 ng/mL (ref 0.0–4.0)

## 2024-01-26 LAB — HEMOGLOBIN A1C
Est. average glucose Bld gHb Est-mCnc: 120 mg/dL
Hgb A1c MFr Bld: 5.8 % — ABNORMAL HIGH (ref 4.8–5.6)

## 2024-01-26 LAB — LIPID PANEL
Chol/HDL Ratio: 3 ratio (ref 0.0–5.0)
Cholesterol, Total: 168 mg/dL (ref 100–199)
HDL: 56 mg/dL (ref >39)
LDL Chol Calc (NIH): 80 mg/dL (ref 0–99)
Triglycerides: 189 mg/dL — ABNORMAL HIGH (ref 0–149)
VLDL Cholesterol Cal: 32 mg/dL (ref 5–40)

## 2024-01-26 LAB — T4, FREE: Free T4: 1.32 ng/dL (ref 0.82–1.77)

## 2024-01-26 LAB — TSH: TSH: 4.33 u[IU]/mL (ref 0.450–4.500)

## 2024-01-26 MED ORDER — AMLODIPINE BESYLATE 5 MG PO TABS: 5.0000 mg | ORAL_TABLET | Freq: Every day | ORAL | 1 refills | Status: DC

## 2024-02-07 ENCOUNTER — Other Ambulatory Visit: Payer: Self-pay | Admitting: Family Medicine

## 2024-02-07 DIAGNOSIS — I10 Essential (primary) hypertension: Secondary | ICD-10-CM

## 2024-02-07 DIAGNOSIS — E785 Hyperlipidemia, unspecified: Secondary | ICD-10-CM

## 2024-05-02 ENCOUNTER — Other Ambulatory Visit: Payer: Self-pay | Admitting: Family Medicine

## 2024-05-18 DIAGNOSIS — H524 Presbyopia: Secondary | ICD-10-CM | POA: Diagnosis not present

## 2024-07-01 ENCOUNTER — Ambulatory Visit: Payer: Self-pay

## 2024-07-01 ENCOUNTER — Encounter: Payer: Self-pay | Admitting: Family Medicine

## 2024-07-01 ENCOUNTER — Ambulatory Visit (INDEPENDENT_AMBULATORY_CARE_PROVIDER_SITE_OTHER): Admitting: Family Medicine

## 2024-07-01 VITALS — BP 138/75 | HR 57 | Ht 68.0 in | Wt 211.0 lb

## 2024-07-01 DIAGNOSIS — J029 Acute pharyngitis, unspecified: Secondary | ICD-10-CM | POA: Diagnosis not present

## 2024-07-01 DIAGNOSIS — R0989 Other specified symptoms and signs involving the circulatory and respiratory systems: Secondary | ICD-10-CM | POA: Diagnosis not present

## 2024-07-01 LAB — POC COVID19/FLU A&B COMBO
Covid Antigen, POC: NEGATIVE
Influenza A Antigen, POC: NEGATIVE
Influenza B Antigen, POC: NEGATIVE

## 2024-07-01 LAB — POCT RAPID STREP A (OFFICE): Rapid Strep A Screen: NEGATIVE

## 2024-07-01 NOTE — Progress Notes (Signed)
 Established patient visit   Patient: Reginald Simpson   DOB: 10-09-1956   68 y.o. Male  MRN: [id number] Visit Date: 07/01/2024  Today's healthcare provider: Nancyann Perry, MD   Chief Complaint  Patient presents with   Tongue    Tongue swelling it feels like something is scraping across his tongue, this has been going on for a weeks he was out of the country for two weeks, it started out with congesting coughing, sore tongue and sinus pressure today his throat started to hurt which is why he came in    Subjective    Discussed the use of AI scribe software for clinical note transcription with the patient, who gave verbal consent to proceed.  History of Present Illness   Reginald Simpson is a 68 year old male who presents with a swollen and irritated tongue following a recent trip to [place].  He developed a cough during a recent trip to [place], which initially improved upon returning home. However, he then noticed swelling and irritation of his tongue and the area underneath it over the past few days. The tongue feels swollen and sore, making it difficult to eat, although it does not appear visibly swollen. He has not taken any medication for the pain.  He reports a persistent cough that has improved but is still present, with some chest involvement. He reports soreness in the jaw area, some pain or soreness in the ears, and a mild headache. He experiences sinus pressure when lying down at night, requiring him to prop his head up to alleviate stuffiness.  He is currently taking meloxicam regularly at night and has been doing so consistently, although he used to skip doses occasionally. He has no issues with other anti-inflammatory medications like Motrin or Advil.  He recently returned from a trip to [place], where he attended his oldest son's wedding and traveled through several countries including the [place], [place], and [place].       Medications: Outpatient  Medications Prior to Visit  Medication Sig   amLODipine (NORVASC) 5 MG tablet Take 1 tablet (5 mg total) by mouth daily.   aspirin 81 MG tablet Take 81 mg by mouth daily.   clopidogrel (PLAVIX) 75 MG tablet TAKE ONE TABLET BY MOUTH ONE TIME DAILY   hydrochlorothiazide (MICROZIDE) 12.5 MG capsule TAKE ONE CAPSULE BY MOUTH ONE TIME DAILY   levothyroxine (SYNTHROID) 137 MCG tablet TAKE ONE TABLET BY MOUTH ONE TIME DAILY BEFORE BREAKFAST   meloxicam (MOBIC) 15 MG tablet Take 15 mg by mouth daily.   rosuvastatin (CRESTOR) 5 MG tablet TAKE ONE TABLET BY MOUTH ONE TIME DAILY   No facility-administered medications prior to visit.   Review of Systems     Objective    BP 138/75   Pulse (!) 57   Ht 5' 8 (1.727 m)   Wt 211 lb (95.7 kg)   SpO2 97%   BMI 32.08 kg/m   Physical Exam   General Appearance:    Well developed, well nourished male, alert, cooperative, in no acute distress  HENT:   neck has bilateral anterior cervical and pre-auricular nodes enlarged, pharynx and palate erythematous without exudate, sinuses nontender, and nasal mucosa congested. No appreciable swelling of tongue or oral mucosa.   Eyes:    PERRL, conjunctiva/corneas clear, EOM's intact       Lungs:     Clear to auscultation bilaterally, respirations unlabored  Heart:    Bradycardic. Normal rhythm. No  murmurs, rubs, or gallops.    Neurologic:   Awake, alert, oriented x 3. No apparent focal neurological           defect.       Results for orders placed or performed in visit on 07/01/24  POCT rapid strep A  Result Value Ref Range   Rapid Strep A Screen Negative Negative  POC Covid19/Flu A&B Antigen  Result Value Ref Range   Influenza A Antigen, POC Negative Negative   Influenza B Antigen, POC Negative Negative   Covid Antigen, POC Negative Negative     Assessment & Plan        Viral upper respiratory infection with pharyngitis and oral mucosal involvement Symptoms suggest adenovirus infection. Negative for  strep, COVID-19, and flu. Self-limiting, expected resolution in 7-10 days. - Advise warm salt water gargles every few hours. - Recommend acetaminophen  for pain relief, avoid combining with other anti-inflammatories due to meloxicam use. - Advise against antiseptic mouthwashes to prevent mucosal irritation.    Call if symptoms change or if not rapidly improving.        Nancyann Perry, MD  [facility] [phone] (phone) [phone] (fax)  [facility]

## 2024-07-01 NOTE — Telephone Encounter (Signed)
 FYI Only or Action Required?: FYI only for provider.  Patient was last seen in primary care on 01/25/2024 by Gasper Nancyann BRAVO, MD.  Called Nurse Triage reporting Oral Swelling - tongue swelling, gum swelling. Throat swelling for the past day and 1/2. Will call EMS if needed.  Symptoms began yesterday.  Interventions attempted: Nothing.  Symptoms are: stable.  Triage Disposition: See Today in Office  Patient/caregiver understands and will follow disposition?: Yes                  Copied from CRM [id number]. Topic: Clinical - Red Word Triage >> Jul 01, 2024  8:21 AM Gennette ORN wrote: Red Word that prompted transfer to Nurse Triage: Patient's wife is calling in because he has swelling on tongue she said it hurts. His throat hurts as well. Patient is having some congestion, and body aches. He also is having severe headaches as well. Reason for Disposition  All other people with tongue swelling  (Exception: Tongue swelling is a recurrent problem AND NO swelling at present.)  Answer Assessment - Initial Assessment Questions 1. ONSET: When did the swelling start? (e.g., minutes, hours, days)     2 days ago 2. LOCATION: What part of the tongue is swollen?     Whole tongue - gums are tender 3. SEVERITY: How swollen is it?     moderate 4. CAUSE: What do you think is causing the tongue swelling? (e.g., history of angioedema, allergies)     unsure 5. RECURRENT SYMPTOM: Have you had tongue swelling before? If Yes, ask: When was the last time? What happened that time?     no 6. OTHER SYMPTOMS: Do you have any other symptoms? (e.g., breathing difficulty, facial swelling)     Sore throat  Protocols used: Tongue Swelling-A-AH

## 2024-07-09 ENCOUNTER — Other Ambulatory Visit: Payer: Self-pay | Admitting: Family Medicine

## 2024-07-09 DIAGNOSIS — G459 Transient cerebral ischemic attack, unspecified: Secondary | ICD-10-CM

## 2024-07-09 DIAGNOSIS — I10 Essential (primary) hypertension: Secondary | ICD-10-CM

## 2024-07-11 NOTE — Telephone Encounter (Signed)
 Requested Prescriptions  Pending Prescriptions Disp Refills   amLODipine  (NORVASC ) 5 MG tablet [Pharmacy Med Name: AMLODIPINE  5 MG TAB] 90 tablet 0    Sig: TAKE ONE TABLET BY MOUTH ONE TIME DAILY *DOSE CHANGE*     Cardiovascular: Calcium  Channel Blockers 2 Passed - 07/11/2024  3:30 PM      Passed - Last BP in normal range    BP Readings from Last 1 Encounters:  07/01/24 138/75         Passed - Last Heart Rate in normal range    Pulse Readings from Last 1 Encounters:  07/01/24 (!) 57         Passed - Valid encounter within last 6 months    Recent Outpatient Visits           1 week ago Sore throat   [place] [facility] Gasper Nancyann BRAVO, MD   5 months ago Annual physical exam   [facility] Gasper Nancyann BRAVO, MD               clopidogrel  (PLAVIX ) 75 MG tablet [Pharmacy Med Name: CLOPIDOGREL  75 MG TAB[*]] 90 tablet 0    Sig: TAKE ONE TABLET BY MOUTH ONE TIME DAILY     Hematology: Antiplatelets - clopidogrel  Passed - 07/11/2024  3:30 PM      Passed - HCT in normal range and within 180 days    Hematocrit  Date Value Ref Range Status  01/25/2024 50.7 37.5 - 51.0 % Final         Passed - HGB in normal range and within 180 days    Hemoglobin  Date Value Ref Range Status  01/25/2024 16.8 13.0 - 17.7 g/dL Final         Passed - PLT in normal range and within 180 days    Platelets  Date Value Ref Range Status  01/25/2024 269 150 - 450 x10E3/uL Final         Passed - Cr in normal range and within 360 days    Creatinine, Ser  Date Value Ref Range Status  01/25/2024 0.82 0.76 - 1.27 mg/dL Final         Passed - Valid encounter within last 6 months    Recent Outpatient Visits           1 week ago Sore throat   [facility] [facility] Gasper Nancyann BRAVO, MD   5 months ago Annual physical exam   [facility] Gasper Nancyann BRAVO, MD               hydrochlorothiazide  (MICROZIDE ) 12.5  MG capsule [Pharmacy Med Name: HYDROCHLOROTHIAZIDE  12.5 MG CAP] 90 capsule 0    Sig: TAKE ONE CAPSULE BY MOUTH ONE TIME DAILY     Cardiovascular: Diuretics - Thiazide Passed - 07/11/2024  3:30 PM      Passed - Cr in normal range and within 180 days    Creatinine, Ser  Date Value Ref Range Status  01/25/2024 0.82 0.76 - 1.27 mg/dL Final         Passed - K in normal range and within 180 days    Potassium  Date Value Ref Range Status  01/25/2024 4.9 3.5 - 5.2 mmol/L Final         Passed - Na in normal range and within 180 days    Sodium  Date Value Ref Range Status  01/25/2024 140 134 - 144 mmol/L Final  Passed - Last BP in normal range    BP Readings from Last 1 Encounters:  07/01/24 138/75         Passed - Valid encounter within last 6 months    Recent Outpatient Visits           1 week ago Sore throat   [place] [facility] Gasper Nancyann BRAVO, MD   5 months ago Annual physical exam   [facility] Gasper Nancyann BRAVO, MD

## 2024-07-12 ENCOUNTER — Encounter: Payer: Self-pay | Admitting: Family Medicine

## 2024-07-14 MED ORDER — NYSTATIN 100000 UNIT/ML MT SUSP: 5.0000 mL | Freq: Four times a day (QID) | OROMUCOSAL | 0 refills | Status: AC | PRN

## 2024-09-07 DIAGNOSIS — D2262 Melanocytic nevi of left upper limb, including shoulder: Secondary | ICD-10-CM | POA: Diagnosis not present

## 2024-09-07 DIAGNOSIS — L570 Actinic keratosis: Secondary | ICD-10-CM | POA: Diagnosis not present

## 2024-09-07 DIAGNOSIS — D2271 Melanocytic nevi of right lower limb, including hip: Secondary | ICD-10-CM | POA: Diagnosis not present

## 2024-09-07 DIAGNOSIS — D2272 Melanocytic nevi of left lower limb, including hip: Secondary | ICD-10-CM | POA: Diagnosis not present

## 2024-09-07 DIAGNOSIS — D2261 Melanocytic nevi of right upper limb, including shoulder: Secondary | ICD-10-CM | POA: Diagnosis not present

## 2024-09-07 DIAGNOSIS — D485 Neoplasm of uncertain behavior of skin: Secondary | ICD-10-CM | POA: Diagnosis not present

## 2024-09-07 DIAGNOSIS — D225 Melanocytic nevi of trunk: Secondary | ICD-10-CM | POA: Diagnosis not present

## 2024-09-07 DIAGNOSIS — C44519 Basal cell carcinoma of skin of other part of trunk: Secondary | ICD-10-CM | POA: Diagnosis not present

## 2024-09-07 DIAGNOSIS — L821 Other seborrheic keratosis: Secondary | ICD-10-CM | POA: Diagnosis not present

## 2024-10-05 DIAGNOSIS — C44519 Basal cell carcinoma of skin of other part of trunk: Secondary | ICD-10-CM | POA: Diagnosis not present

## 2024-10-14 ENCOUNTER — Telehealth: Payer: Self-pay

## 2024-11-23 ENCOUNTER — Other Ambulatory Visit: Payer: Self-pay | Admitting: Family Medicine

## 2024-11-23 DIAGNOSIS — E785 Hyperlipidemia, unspecified: Secondary | ICD-10-CM

## 2024-11-23 DIAGNOSIS — I10 Essential (primary) hypertension: Secondary | ICD-10-CM

## 2024-12-10 ENCOUNTER — Other Ambulatory Visit: Payer: Self-pay | Admitting: Family Medicine

## 2024-12-10 DIAGNOSIS — G459 Transient cerebral ischemic attack, unspecified: Secondary | ICD-10-CM

## 2024-12-10 DIAGNOSIS — I10 Essential (primary) hypertension: Secondary | ICD-10-CM
